# Patient Record
Sex: Female | Born: 1935 | Race: Black or African American | Hispanic: No | Smoking: Never smoker
Health system: Southern US, Community
[De-identification: ages and names within clinical notes are randomized; demographics above are authoritative.]

## PROBLEM LIST (undated history)

## (undated) DIAGNOSIS — R011 Cardiac murmur, unspecified: Secondary | ICD-10-CM

## (undated) DIAGNOSIS — I10 Essential (primary) hypertension: Secondary | ICD-10-CM

## (undated) HISTORY — PX: TONSILLECTOMY: SUR1361

## (undated) HISTORY — DX: Cardiac murmur, unspecified: R01.1

## (undated) HISTORY — PX: WISDOM TOOTH EXTRACTION: SHX21

## (undated) HISTORY — DX: Essential (primary) hypertension: I10

---

## 1971-06-17 HISTORY — PX: ABDOMINAL HYSTERECTOMY: SHX81

## 1971-06-17 HISTORY — PX: OTHER SURGICAL HISTORY: SHX169

## 2011-05-13 ENCOUNTER — Encounter (INDEPENDENT_AMBULATORY_CARE_PROVIDER_SITE_OTHER): Payer: Self-pay | Admitting: Surgery

## 2011-05-13 ENCOUNTER — Other Ambulatory Visit (INDEPENDENT_AMBULATORY_CARE_PROVIDER_SITE_OTHER): Payer: Self-pay | Admitting: General Surgery

## 2011-05-13 ENCOUNTER — Ambulatory Visit (INDEPENDENT_AMBULATORY_CARE_PROVIDER_SITE_OTHER): Payer: Medicare Other | Admitting: Surgery

## 2011-05-13 ENCOUNTER — Other Ambulatory Visit (INDEPENDENT_AMBULATORY_CARE_PROVIDER_SITE_OTHER): Payer: Self-pay | Admitting: Surgery

## 2011-05-13 VITALS — BP 136/86 | HR 72 | Temp 96.7°F | Resp 16 | Ht 66.0 in | Wt 205.1 lb

## 2011-05-13 DIAGNOSIS — L723 Sebaceous cyst: Secondary | ICD-10-CM

## 2011-05-13 DIAGNOSIS — L02212 Cutaneous abscess of back [any part, except buttock]: Secondary | ICD-10-CM

## 2011-05-13 DIAGNOSIS — L089 Local infection of the skin and subcutaneous tissue, unspecified: Secondary | ICD-10-CM

## 2011-05-13 DIAGNOSIS — L02219 Cutaneous abscess of trunk, unspecified: Secondary | ICD-10-CM

## 2011-05-13 DIAGNOSIS — L03319 Cellulitis of trunk, unspecified: Secondary | ICD-10-CM

## 2011-05-13 MED ORDER — HYDROCODONE-ACETAMINOPHEN 5-500 MG PO TABS: 1.0000 | ORAL_TABLET | Freq: Every day | ORAL | Status: AC

## 2011-05-13 MED ORDER — DOXYCYCLINE HYCLATE 100 MG PO TABS: 100.0000 mg | ORAL_TABLET | Freq: Two times a day (BID) | ORAL | Status: AC

## 2011-05-13 NOTE — Progress Notes (Signed)
Patient ID: Kristina Randall, female   DOB: 1933-08-20, 77 y.o.   MRN: [id number]  Chief Complaint  Patient presents with  . Other    recurrent upper left back abscess     HPI Kristina Randall is a 77 y.o. female.HPI The patient presents at the request of Dr. Parke Simmers due to an abscess over her left shoulder. It has been present for one week. It is red. It is painful. It is not draining. She denies any fever or chills. She has had this problem in the past.  Past Medical History  Diagnosis Date  . Hypertension   . Heart murmur     Past Surgical History  Procedure Date  . Abdominal hysterectomy 1973  . Breast surgery 1974    right breast lumpectomy   . Tonsillectomy   . Wisdom tooth extraction     History reviewed. No pertinent family history.  Social History History  Substance Use Topics  . Smoking status: Never Smoker   . Smokeless tobacco: Never Used  . Alcohol Use: No    No Known Allergies  Current Outpatient Prescriptions  Medication Sig Dispense Refill  . Cholecalciferol (VITAMIN D PO) Take by mouth daily.        . Cyanocobalamin (VITAMIN B-12 PO) Take by mouth daily.        . fish oil-omega-3 fatty acids 1000 MG capsule Take 2 g by mouth daily.        Marland Kitchen GARLIC PO Take by mouth daily.        . Multiple Vitamin (MULTIVITAMIN PO) Take by mouth daily.        . valsartan-hydrochlorothiazide (DIOVAN-HCT) 320-25 MG per tablet Take 1 tablet by mouth daily.        Marland Kitchen VITAMIN A PO Take by mouth daily.        Marland Kitchen doxycycline (VIBRA-TABS) 100 MG tablet Take 1 tablet (100 mg total) by mouth 2 (two) times daily. For infection  28 tablet  0  . HYDROcodone-acetaminophen (VICODIN) 5-500 MG per tablet Take 1 tablet by mouth at bedtime.  20 tablet  0    Review of Systems Review of Systems  Constitutional: Negative for fever, chills and unexpected weight change.  HENT: Negative for hearing loss, congestion, sore throat, trouble swallowing and voice change.   Eyes: Negative for visual disturbance.    Respiratory: Negative for cough and wheezing.   Cardiovascular: Negative for chest pain, palpitations and leg swelling.  Gastrointestinal: Negative for nausea, vomiting, abdominal pain, diarrhea, constipation, blood in stool, abdominal distention and anal bleeding.  Genitourinary: Negative for hematuria, vaginal bleeding and difficulty urinating.  Musculoskeletal: Negative for arthralgias.  Skin: Negative for rash and wound.  Neurological: Negative for seizures, syncope and headaches.  Hematological: Negative for adenopathy. Does not bruise/bleed easily.  Psychiatric/Behavioral: Negative for confusion.    Blood pressure 136/86, pulse 72, temperature 96.7 F (35.9 C), temperature source Temporal, resp. rate 16, height 5\' 6"  (1.676 m), weight 205 lb 2 oz (93.044 kg).  Physical Exam Physical Exam  Constitutional: She appears well-developed and well-nourished.  HENT:  Head: Normocephalic and atraumatic.  Eyes: EOM are normal. Pupils are equal, round, and reactive to light.  Skin:       4 cm abscess over left shoulder red and fluctuant not draining    Data Reviewed   Assessment    Abscess left shoulder measuring 4 cm    Plan    I recommend incision and drainage under local anesthesia today in the office. Risk  of bleeding, infection and recurrence or further surgery discussed. She agrees proceed. Under sterile conditions the skin over her left shoulder was prepped. Using Betadine. 1% lidocaine was used and injected the left shoulder abscess. This was excised using a cruciate incision. Pus drained. It was cultured. This was packed with quarter-inch packing. A dry dressing applied. She tolerated the procedure well.       Jaely Silman A. 05/13/2011, 3:54 PM

## 2011-05-13 NOTE — Patient Instructions (Signed)
Abscess An abscess (boil or furuncle) is an infected area that contains a collection of pus.  SYMPTOMS Signs and symptoms of an abscess include pain, tenderness, redness, or hardness. You may feel a moveable soft area under your skin. An abscess can occur anywhere in the body.  TREATMENT  A surgical cut (incision) may be made over your abscess to drain the pus. Gauze may be packed into the space or a drain may be looped through the abscess cavity (pocket). This provides a drain that will allow the cavity to heal from the inside outwards. The abscess may be painful for a few days, but should feel much better if it was drained.  Your abscess, if seen early, may not have localized and may not have been drained. If not, another appointment may be required if it does not get better on its own or with medications. HOME CARE INSTRUCTIONS   Only take over-the-counter or prescription medicines for pain, discomfort, or fever as directed by your caregiver.   Take your antibiotics as directed if they were prescribed. Finish them even if you start to feel better.   Keep the skin and clothes clean around your abscess.   If the abscess was drained, you will need to use gauze dressing to collect any draining pus. Dressings will typically need to be changed 3 or more times a day.   The infection may spread by skin contact with others. Avoid skin contact as much as possible.   Practice good hygiene. This includes regular hand washing, cover any draining skin lesions, and do not share personal care items.   If you participate in sports, do not share athletic equipment, towels, whirlpools, or personal care items. Shower after every practice or tournament.   If a draining area cannot be adequately covered:   Do not participate in sports.   Children should not participate in day care until the wound has healed or drainage stops.   If your caregiver has given you a follow-up appointment, it is very important  to keep that appointment. Not keeping the appointment could result in a much worse infection, chronic or permanent injury, pain, and disability. If there is any problem keeping the appointment, you must call back to this facility for assistance.  SEEK MEDICAL CARE IF:   You develop increased pain, swelling, redness, drainage, or bleeding in the wound site.   You develop signs of generalized infection including muscle aches, chills, fever, or a general ill feeling.   You have an oral temperature above 102 F (38.9 C).  MAKE SURE YOU:   Understand these instructions.   Will watch your condition.   Will get help right away if you are not doing well or get worse.  Document Released: 03/12/2005 Document Revised: 02/12/2011 Document Reviewed: 01/04/2008 [facility] Patient Information 2012 [place], [place].Incision and Drainage of Abscess An abscess (boil or furuncle) is an area infected by germs that contains a collection of pus. Signs and problems (symptoms) of an abscess include pain, tenderness, redness, or hardness. You may feel a moveable, soft area under your skin. An abscess can occur anywhere in the body. Occasionally, this may spread to surrounding tissues causing cellulitis. Sometimes, a surgeon may make a cut (incision) over your abscess. The pus is drained. Gauze may be packed into the space to provide a drain. Keeping a drain or piece of gauze in the incision keeps the skin from healing first. This helps stop the abscess from forming again. The area may be  painful for 5 to 7 days. Most people with an abscess do not have high fevers. If seen early, your abscess may not have localized and may not be cut. If it does not get better on its own or with medicines, you may require another appointment. HOME CARE INSTRUCTIONS   Only take over-the-counter or prescription medicines for pain, discomfort, or fever as directed by your caregiver. Use these only if your caregiver has not given medicines that  would interfere.   When you bathe, remove the gauze drain after soaking. You may then wash the wound gently with mild, soapy water. Repack with gauze as your caregiver directs.   See your caregiver as directed for a recheck.   If antibiotics were prescribed, take them as directed.  SEEK MEDICAL CARE IF:   You develop increased pain, swelling, redness, drainage, or bleeding in the wound site.   You develop signs of generalized infection, including muscle aches, chills, or a general ill feeling.   You or your child has an oral temperature above 102 F (38.9 C).  MAKE SURE YOU:   Understand these instructions.   Will watch your condition.   Will get help right away if you are not doing well or get worse.  Document Released: 11/26/2000 Document Revised: 02/12/2011 Document Reviewed: 01/21/2008 [facility] Patient Information 2012 [place], [place].

## 2011-05-16 LAB — WOUND CULTURE: Organism ID, Bacteria: NO GROWTH

## 2011-05-28 ENCOUNTER — Encounter (INDEPENDENT_AMBULATORY_CARE_PROVIDER_SITE_OTHER): Payer: Medicare Other | Admitting: Surgery

## 2011-06-05 ENCOUNTER — Encounter (INDEPENDENT_AMBULATORY_CARE_PROVIDER_SITE_OTHER): Payer: Self-pay | Admitting: Surgery

## 2011-07-21 ENCOUNTER — Encounter (INDEPENDENT_AMBULATORY_CARE_PROVIDER_SITE_OTHER): Payer: Medicare Other | Admitting: Surgery

## 2011-08-07 ENCOUNTER — Encounter (INDEPENDENT_AMBULATORY_CARE_PROVIDER_SITE_OTHER): Payer: Medicare Other | Admitting: Surgery

## 2014-04-18 LAB — HM MAMMOGRAPHY

## 2015-10-27 ENCOUNTER — Emergency Department (HOSPITAL_COMMUNITY)
Admission: EM | Admit: 2015-10-27 | Discharge: 2015-10-27 | Disposition: A | Discharge status: Home / Self Care | Payer: Medicare Other | Attending: Emergency Medicine | Admitting: Emergency Medicine

## 2015-10-27 ENCOUNTER — Encounter (HOSPITAL_COMMUNITY): Payer: Self-pay | Admitting: Emergency Medicine

## 2015-10-27 DIAGNOSIS — I10 Essential (primary) hypertension: Secondary | ICD-10-CM | POA: Diagnosis not present

## 2015-10-27 DIAGNOSIS — Z79899 Other long term (current) drug therapy: Secondary | ICD-10-CM | POA: Insufficient documentation

## 2015-10-27 DIAGNOSIS — Z760 Encounter for issue of repeat prescription: Secondary | ICD-10-CM | POA: Insufficient documentation

## 2015-10-27 DIAGNOSIS — R011 Cardiac murmur, unspecified: Secondary | ICD-10-CM | POA: Insufficient documentation

## 2015-10-27 MED ORDER — VALSARTAN-HYDROCHLOROTHIAZIDE 320-25 MG PO TABS: 1.0000 | ORAL_TABLET | Freq: Every day | ORAL | Status: DC

## 2015-10-27 NOTE — ED Provider Notes (Signed)
CSN: [id number]     Arrival date & time 10/27/15  0919 History   First MD Initiated Contact with Patient 10/27/15 [phone]     Chief Complaint  Patient presents with  . rx refill    (Consider location/radiation/quality/duration/timing/severity/associated sxs/prior Treatment) HPI 82 y.o. female, presents to the Emergency Department today for medication refill. Almost out of BP meds. States that she just moved from [place] and has yet to establish PCP. No acute conditions. No pain. No CP/SOB/ABD pain. No N/V/D. No headaches. No changes in vision. No numbness/tingling. No other symptoms noted.    Past Medical History  Diagnosis Date  . Hypertension   . Heart murmur    Past Surgical History  Procedure Laterality Date  . Abdominal hysterectomy  1973  . Breast surgery  1974    right breast lumpectomy   . Tonsillectomy    . Wisdom tooth extraction     History reviewed. No pertinent family history. Social History  Substance Use Topics  . Smoking status: Never Smoker   . Smokeless tobacco: Never Used  . Alcohol Use: No   OB History    No data available     Review of Systems ROS reviewed and all are negative for acute change except as noted in the HPI.  Allergies  Review of patient's allergies indicates no known allergies.  Home Medications   Prior to Admission medications   Medication Sig Start Date End Date Taking? Authorizing Provider  Cholecalciferol (VITAMIN D PO) Take by mouth daily.      Historical Provider, MD  Cyanocobalamin (VITAMIN B-12 PO) Take by mouth daily.      Historical Provider, MD  fish oil-omega-3 fatty acids 1000 MG capsule Take 2 g by mouth daily.      Historical Provider, MD  GARLIC PO Take by mouth daily.      Historical Provider, MD  Multiple Vitamin (MULTIVITAMIN PO) Take by mouth daily.      Historical Provider, MD  valsartan-hydrochlorothiazide (DIOVAN-HCT) 320-25 MG per tablet Take 1 tablet by mouth daily.      Historical Provider, MD  VITAMIN A  PO Take by mouth daily.      Historical Provider, MD   BP 151/80 mmHg  Pulse 64  Temp(Src) 97.2 F (36.2 C) (Oral)  Resp 18  Wt 81.647 kg  SpO2 98%   Physical Exam  Constitutional: She is oriented to person, place, and time. She appears well-developed and well-nourished.  HENT:  Head: Normocephalic and atraumatic.  Eyes: EOM are normal.  Neck: Normal range of motion.  Cardiovascular: Normal rate, regular rhythm, normal heart sounds and intact distal pulses.   No murmur heard. Pulmonary/Chest: Effort normal and breath sounds normal. No respiratory distress. She has no wheezes. She has no rales. She exhibits no tenderness.  Abdominal: Soft. Bowel sounds are normal.  Musculoskeletal: Normal range of motion.  Neurological: She is alert and oriented to person, place, and time.  Skin: Skin is warm and dry.  Psychiatric: She has a normal mood and affect. Her behavior is normal. Thought content normal.  Nursing note and vitals reviewed.  ED Course  Procedures (including critical care time) Labs Review Labs Reviewed - No data to display  Imaging Review No results found. I have personally reviewed and evaluated these images and lab results as part of my medical decision-making.   EKG Interpretation None      MDM  I have reviewed the relevant previous healthcare records. I obtained HPI from historian.  ED Course:  Assessment: Pt is a 82yF with hx HTN who presents for medication refill. On exam, pt in NAD. Nontoxic/nonseptic appearing. VSS. Afebrile. Lungs CTA. Heart RRR. Abdomen nontender soft. No acute conditions identified. Refilled Valstartan-HCTZ. Plan is to DC Home with follow up to PCp. At time of discharge, Patient is in no acute distress. Vital Signs are stable. Patient is able to ambulate. Patient able to tolerate PO.    Disposition/Plan:  DC Home Additional Verbal discharge instructions given and discussed with patient.  Pt Instructed to f/u with PCP in the next week  for evaluation and treatment of symptoms. Return precautions given Pt acknowledges and agrees with plan  Supervising Physician Raeford Razor, MD   Final diagnoses:  Medication refill      Audry Pili, PA-C 10/27/15 1610  Raeford Razor, MD 10/28/15 [phone]

## 2015-10-27 NOTE — ED Notes (Signed)
Pt recently moved back to [place] and doesn't have a regular doctor, needs BP meds refilled-- in process of trying to find a regular MD

## 2015-10-27 NOTE — Discharge Instructions (Signed)
Please read and follow all provided instructions.  Your diagnoses today include:  1. Medication refill    Tests performed today include:  Vital signs. See below for your results today.   Medications prescribed:   Take as prescribed   Home care instructions:  Follow any educational materials contained in this packet.  Follow-up instructions: Please follow-up with your primary care provider in the next 48 hours for further evaluation of symptoms and treatment   Return instructions:   Please return to the Emergency Department if you do not get better, if you get worse, or new symptoms OR  - Fever (temperature greater than 101.70F)  - Bleeding that does not stop with holding pressure to the area    -Severe pain (please note that you may be more sore the day after your accident)  - Chest Pain  - Difficulty breathing  - Severe nausea or vomiting  - Inability to tolerate food and liquids  - Passing out  - Skin becoming red around your wounds  - Change in mental status (confusion or lethargy)  - New numbness or weakness     Please return if you have any other emergent concerns.  Additional Information:  Your vital signs today were: BP 151/80 mmHg   Pulse 64   Temp(Src) 97.2 F (36.2 C) (Oral)   Resp 18   Wt 81.647 kg   SpO2 98% If your blood pressure (BP) was elevated above 135/85 this visit, please have this repeated by your doctor within one month. ---------------

## 2015-12-10 ENCOUNTER — Encounter (HOSPITAL_COMMUNITY): Payer: Self-pay | Admitting: Emergency Medicine

## 2015-12-10 ENCOUNTER — Ambulatory Visit (HOSPITAL_COMMUNITY)
Admission: EM | Admit: 2015-12-10 | Discharge: 2015-12-10 | Disposition: A | Discharge status: Home / Self Care | Payer: Medicare HMO | Attending: Family Medicine | Admitting: Family Medicine

## 2015-12-10 DIAGNOSIS — I10 Essential (primary) hypertension: Secondary | ICD-10-CM

## 2015-12-10 LAB — POCT I-STAT, CHEM 8
BUN: 15 mg/dL (ref 6–20)
CREATININE: 1 mg/dL (ref 0.44–1.00)
Calcium, Ion: 1.24 mmol/L (ref 1.13–1.30)
Chloride: 106 mmol/L (ref 101–111)
Glucose, Bld: 88 mg/dL (ref 65–99)
HEMATOCRIT: 38 % (ref 36.0–46.0)
Hemoglobin: 12.9 g/dL (ref 12.0–15.0)
POTASSIUM: 4.5 mmol/L (ref 3.5–5.1)
Sodium: 146 mmol/L — ABNORMAL HIGH (ref 135–145)
TCO2: 28 mmol/L (ref 0–100)

## 2015-12-10 MED ORDER — VALSARTAN-HYDROCHLOROTHIAZIDE 320-25 MG PO TABS: 1.0000 | ORAL_TABLET | Freq: Every day | ORAL | Status: DC

## 2015-12-10 NOTE — ED Notes (Signed)
The patient presented to the [facility] with a complaint of need ing her HTN medicine, Diovan, refilled as well as a referral to a PCP.

## 2015-12-10 NOTE — Discharge Instructions (Signed)
Take your medicine and see your doctor for further refills.

## 2015-12-10 NOTE — ED Provider Notes (Signed)
CSN: [id number]     Arrival date & time 12/10/15  1259 History   First MD Initiated Contact with Patient 12/10/15 1317     Chief Complaint  Patient presents with  . Medication Refill   (Consider location/radiation/quality/duration/timing/severity/associated sxs/prior Treatment) Patient is a 82 y.o. female presenting with hypertension. The history is provided by the patient.  Hypertension This is a chronic problem. The current episode started 2 days ago (ran out of med 2d ago.). The problem has not changed since onset.Pertinent negatives include no chest pain and no headaches.    Past Medical History  Diagnosis Date  . Hypertension   . Heart murmur    Past Surgical History  Procedure Laterality Date  . Abdominal hysterectomy  1973  . Breast surgery  1974    right breast lumpectomy   . Tonsillectomy    . Wisdom tooth extraction     History reviewed. No pertinent family history. Social History  Substance Use Topics  . Smoking status: Never Smoker   . Smokeless tobacco: Never Used  . Alcohol Use: No   OB History    No data available     Review of Systems  Constitutional: Negative.   Respiratory: Negative.   Cardiovascular: Negative.  Negative for chest pain and leg swelling.  Neurological: Negative.  Negative for dizziness and headaches.  All other systems reviewed and are negative.   Allergies  Review of patient's allergies indicates no known allergies.  Home Medications   Prior to Admission medications   Medication Sig Start Date End Date Taking? Authorizing Provider  Cholecalciferol (VITAMIN D PO) Take by mouth daily.      Historical Provider, MD  Cyanocobalamin (VITAMIN B-12 PO) Take by mouth daily.      Historical Provider, MD  fish oil-omega-3 fatty acids 1000 MG capsule Take 2 g by mouth daily.      Historical Provider, MD  GARLIC PO Take by mouth daily.      Historical Provider, MD  Multiple Vitamin (MULTIVITAMIN PO) Take by mouth daily.      Historical  Provider, MD  valsartan-hydrochlorothiazide (DIOVAN-HCT) 320-25 MG tablet Take 1 tablet by mouth daily. 12/10/15   Linna HoffJames D Cristina Mattern, MD  VITAMIN A PO Take by mouth daily.      Historical Provider, MD   Meds Ordered and Administered this Visit  Medications - No data to display  BP 149/75 mmHg  Pulse 59  Temp(Src) 98 F (36.7 C) (Oral)  Resp 18  SpO2 98%  LMP  (Approximate) No data found.   Physical Exam  Constitutional: She is oriented to person, place, and time. She appears well-developed and well-nourished. No distress.  Neck: Normal range of motion. Neck supple.  Cardiovascular: Normal rate, regular rhythm, normal heart sounds and intact distal pulses.   Pulmonary/Chest: Effort normal and breath sounds normal.  Lymphadenopathy:    She has no cervical adenopathy.  Neurological: She is alert and oriented to person, place, and time.  Skin: Skin is warm and dry.  Nursing note and vitals reviewed.   ED Course  Procedures (including critical care time)  Labs Review Labs Reviewed  POCT I-STAT, CHEM 8 - Abnormal; Notable for the following:    Sodium 146 (*)    All other components within normal limits   i-stat wnl.  Imaging Review No results found.   Visual Acuity Review  Right Eye Distance:   Left Eye Distance:   Bilateral Distance:    Right Eye Near:   Left  Eye Near:    Bilateral Near:         MDM   1. Essential hypertension       Linna HoffJames D Gerre Ranum, MD 12/18/15 1659

## 2015-12-11 ENCOUNTER — Telehealth (HOSPITAL_COMMUNITY): Payer: Self-pay | Admitting: Emergency Medicine

## 2015-12-11 NOTE — ED Notes (Signed)
[facility] calling to change her medication Diovan 320-25 to a 320-12.5.  She states she was taking this previously from [facility].  Dr. Artis FlockKindl approved the change to the medication and the pharmacist was notified.

## 2016-01-01 ENCOUNTER — Ambulatory Visit (INDEPENDENT_AMBULATORY_CARE_PROVIDER_SITE_OTHER): Payer: Medicare HMO | Admitting: Nurse Practitioner

## 2016-01-01 ENCOUNTER — Encounter: Payer: Self-pay | Admitting: Nurse Practitioner

## 2016-01-01 VITALS — BP 138/86 | HR 64 | Temp 97.9°F | Resp 17 | Ht 66.5 in | Wt 192.0 lb

## 2016-01-01 DIAGNOSIS — I10 Essential (primary) hypertension: Secondary | ICD-10-CM

## 2016-01-01 DIAGNOSIS — R413 Other amnesia: Secondary | ICD-10-CM | POA: Diagnosis not present

## 2016-01-01 DIAGNOSIS — F411 Generalized anxiety disorder: Secondary | ICD-10-CM

## 2016-01-01 NOTE — Patient Instructions (Signed)
To bring medication and supplements to next visit   Follow up in 1 month for physical with fasting lab work prior to appt
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
## 2016-01-01 NOTE — Addendum Note (Signed)
Addended by: Chriss DriverLANE, Alesa Echevarria L on: 01/01/2016 11:12 AM   Modules accepted: Orders, Medications

## 2016-01-24 ENCOUNTER — Other Ambulatory Visit: Payer: Medicare HMO

## 2016-01-24 DIAGNOSIS — R413 Other amnesia: Secondary | ICD-10-CM

## 2016-01-24 DIAGNOSIS — I10 Essential (primary) hypertension: Secondary | ICD-10-CM

## 2016-01-24 LAB — COMPLETE METABOLIC PANEL WITH GFR
ALBUMIN: 4.3 g/dL (ref 3.6–5.1)
ALK PHOS: 68 U/L (ref 33–130)
ALT: 10 U/L (ref 6–29)
AST: 16 U/L (ref 10–35)
BUN: 14 mg/dL (ref 7–25)
CO2: 30 mmol/L (ref 20–31)
CREATININE: 0.87 mg/dL (ref 0.60–0.88)
Calcium: 9.9 mg/dL (ref 8.6–10.4)
Chloride: 103 mmol/L (ref 98–110)
GFR, Est African American: 73 mL/min (ref >=60)
GFR, Est Non African American: 63 mL/min (ref >=60)
GLUCOSE: 81 mg/dL (ref 65–99)
POTASSIUM: 4.3 mmol/L (ref 3.5–5.3)
SODIUM: 141 mmol/L (ref 135–146)
Total Bilirubin: 0.5 mg/dL (ref 0.2–1.2)
Total Protein: 7.5 g/dL (ref 6.1–8.1)

## 2016-01-24 LAB — CBC WITH DIFFERENTIAL/PLATELET
Basophils Absolute: 0 cells/uL (ref 0–200)
Basophils Relative: 0 %
Eosinophils Absolute: 410 cells/uL (ref 15–500)
Eosinophils Relative: 5 %
HCT: 36 % (ref 35.0–45.0)
Hemoglobin: 11.6 g/dL — ABNORMAL LOW (ref 11.7–15.5)
Lymphocytes Relative: 35 %
Lymphs Abs: 2870 cells/uL (ref 850–3900)
MCH: 26.6 pg — ABNORMAL LOW (ref 27.0–33.0)
MCHC: 32.2 g/dL (ref 32.0–36.0)
MCV: 82.6 fL (ref 80.0–100.0)
MPV: 10.2 fL (ref 7.5–12.5)
Monocytes Absolute: 492 cells/uL (ref 200–950)
Monocytes Relative: 6 %
Neutro Abs: 4428 cells/uL (ref 1500–7800)
Neutrophils Relative %: 54 %
Platelets: 245 10*3/uL (ref 140–400)
RBC: 4.36 MIL/uL (ref 3.80–5.10)
RDW: 16.5 % — ABNORMAL HIGH (ref 11.0–15.0)
WBC: 8.2 10*3/uL (ref 3.8–10.8)

## 2016-01-24 LAB — LIPID PANEL
CHOL/HDL RATIO: 3 ratio (ref <=5.0)
CHOLESTEROL: 154 mg/dL (ref 125–200)
HDL: 51 mg/dL (ref >=46)
LDL Cholesterol: 85 mg/dL (ref <130)
Triglycerides: 92 mg/dL (ref <150)
VLDL: 18 mg/dL (ref <30)

## 2016-01-24 LAB — TSH: TSH: 3.74 mIU/L

## 2016-01-29 ENCOUNTER — Ambulatory Visit (INDEPENDENT_AMBULATORY_CARE_PROVIDER_SITE_OTHER): Payer: Medicare HMO | Admitting: Nurse Practitioner

## 2016-01-29 ENCOUNTER — Encounter: Payer: Self-pay | Admitting: Nurse Practitioner

## 2016-01-29 ENCOUNTER — Telehealth: Payer: Self-pay

## 2016-01-29 VITALS — BP 124/82 | HR 78 | Temp 98.0°F | Resp 18 | Ht 67.0 in | Wt 191.6 lb

## 2016-01-29 DIAGNOSIS — I10 Essential (primary) hypertension: Secondary | ICD-10-CM | POA: Diagnosis not present

## 2016-01-29 DIAGNOSIS — Z1231 Encounter for screening mammogram for malignant neoplasm of breast: Secondary | ICD-10-CM

## 2016-01-29 DIAGNOSIS — I00 Rheumatic fever without heart involvement: Secondary | ICD-10-CM | POA: Insufficient documentation

## 2016-01-29 DIAGNOSIS — Z0000 Encounter for general adult medical examination without abnormal findings: Secondary | ICD-10-CM

## 2016-01-29 DIAGNOSIS — E2839 Other primary ovarian failure: Secondary | ICD-10-CM

## 2016-01-29 DIAGNOSIS — R413 Other amnesia: Secondary | ICD-10-CM | POA: Diagnosis not present

## 2016-01-29 NOTE — Telephone Encounter (Signed)
I called patient to see who [place] provider [place]. Patient needs to complete a medical release form so that her records can be requested from previous provider. While she was in office today for her appointment, she filled out most of the form but she could not remember who her last doctor was. She was going to call the office once she got home with that information.   I could not leave a message due to voicemail not being set up.

## 2016-01-29 NOTE — Patient Instructions (Addendum)
Recommend 30 mins activity 5 days a week Recommend dental care every 6 months  To CALL with previous doctors office so we  can obtain last medical records
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
## 2016-01-30 ENCOUNTER — Telehealth: Payer: Self-pay

## 2016-01-30 NOTE — Telephone Encounter (Signed)
Patient called to give Kristina MageJessica Eubanks,NP and her CMA Kristina Randall(Terry) information requested at her visit yesterday.  Previous PCP was:  Kristina NeedleMichael A. Cevasco, PA    Address: [address] [place] [address], [place], [place] [address]  Phone: [phone]  Patient is still trying to locate her las mammogram information.

## 2016-01-30 NOTE — Telephone Encounter (Signed)
Molli KnockOkay we will need to request records

## 2016-01-30 NOTE — Telephone Encounter (Signed)
Did you or Aurther Lofterry have the patient sign a release of records yesterday?

## 2016-01-31 NOTE — Telephone Encounter (Signed)
Patient did sign a release of records form while she was in the office on 01/29/16. Release form was given to Nicole CellaDorothy to fax to patient's previous provider yesterday.

## 2016-02-12 ENCOUNTER — Telehealth: Payer: Self-pay

## 2016-02-12 NOTE — Telephone Encounter (Signed)
I called patient's pervious doctor to find out about patient's vaccine history. I spoke to Nellis AFBMichelle and she told me that the patient did not have any vaccine records on file with them. She stated that her office only give the flu vaccine and patient declined to get that vaccine in 2015. This is the only record that they have as far as vaccine history goes for this patient.

## 2016-02-12 NOTE — Telephone Encounter (Signed)
I called patient to ask if what provider she had been seeing since 2015 because the records we received from her previous provider([facility])  were only for 2015.  Patient stated that she has not seen a regular doctor since 2015. She [facility] to see about getting a refill on her blood pressure medications.   I also asked patient for the name of the provider that she was seen by before she became a patient of [facility]. Patient stated that she could not remember the name of who she saw but she would call back if she remembered it.

## 2016-02-13 NOTE — Telephone Encounter (Signed)
Patient did not return my call about which provider she was seen by before she went to [facility].

## 2016-02-14 ENCOUNTER — Telehealth: Payer: Self-pay

## 2016-02-14 NOTE — Telephone Encounter (Signed)
Patient brought in the name and address of the provider that she saw prior to moving to [place], where [facility] was [facility].   The requested information is as follows:  [facility] [address], [address] [place] [address]  Dr. Geraldo PitterVeita J. Bland  Phone: [phone] Fax; [phone]

## 2016-02-15 ENCOUNTER — Other Ambulatory Visit: Payer: Self-pay | Admitting: *Deleted

## 2016-02-15 MED ORDER — VALSARTAN-HYDROCHLOROTHIAZIDE 320-12.5 MG PO TABS: 1.0000 | ORAL_TABLET | Freq: Every day | ORAL | 1 refills | Status: DC

## 2016-02-15 NOTE — Telephone Encounter (Signed)
[facility] 

## 2016-02-19 ENCOUNTER — Other Ambulatory Visit: Payer: Self-pay | Admitting: *Deleted

## 2016-02-19 MED ORDER — VALSARTAN-HYDROCHLOROTHIAZIDE 320-12.5 MG PO TABS: 1.0000 | ORAL_TABLET | Freq: Every day | ORAL | 3 refills | Status: DC

## 2016-02-19 NOTE — Telephone Encounter (Signed)
[facility]

## 2016-03-26 ENCOUNTER — Encounter: Payer: Self-pay | Admitting: Nurse Practitioner

## 2016-03-31 ENCOUNTER — Other Ambulatory Visit: Payer: Self-pay | Admitting: Nurse Practitioner

## 2016-03-31 DIAGNOSIS — Z1239 Encounter for other screening for malignant neoplasm of breast: Secondary | ICD-10-CM

## 2016-04-14 ENCOUNTER — Ambulatory Visit
Admission: RE | Admit: 2016-04-14 | Discharge: 2016-04-14 | Disposition: A | Discharge status: Home / Self Care | Payer: Medicare HMO | Source: Ambulatory Visit | Attending: Nurse Practitioner | Admitting: Nurse Practitioner

## 2016-04-14 ENCOUNTER — Other Ambulatory Visit: Payer: Medicare HMO

## 2016-04-14 DIAGNOSIS — Z1239 Encounter for other screening for malignant neoplasm of breast: Secondary | ICD-10-CM

## 2016-04-15 ENCOUNTER — Ambulatory Visit
Admission: RE | Admit: 2016-04-15 | Discharge: 2016-04-15 | Disposition: A | Discharge status: Home / Self Care | Payer: Medicare HMO | Source: Ambulatory Visit | Attending: Nurse Practitioner | Admitting: Nurse Practitioner

## 2016-04-15 DIAGNOSIS — E2839 Other primary ovarian failure: Secondary | ICD-10-CM

## 2016-04-28 ENCOUNTER — Encounter: Payer: Self-pay | Admitting: Internal Medicine

## 2016-04-28 ENCOUNTER — Ambulatory Visit (INDEPENDENT_AMBULATORY_CARE_PROVIDER_SITE_OTHER): Payer: Medicare HMO | Admitting: Internal Medicine

## 2016-04-28 VITALS — BP 130/80 | HR 80 | Temp 97.8°F | Wt 192.0 lb

## 2016-04-28 DIAGNOSIS — I10 Essential (primary) hypertension: Secondary | ICD-10-CM | POA: Diagnosis not present

## 2016-04-28 DIAGNOSIS — Z23 Encounter for immunization: Secondary | ICD-10-CM

## 2016-04-28 DIAGNOSIS — R011 Cardiac murmur, unspecified: Secondary | ICD-10-CM | POA: Diagnosis not present

## 2016-04-28 DIAGNOSIS — Z8679 Personal history of other diseases of the circulatory system: Secondary | ICD-10-CM | POA: Diagnosis not present

## 2016-04-28 DIAGNOSIS — G3184 Mild cognitive impairment, so stated: Secondary | ICD-10-CM | POA: Insufficient documentation

## 2016-04-28 NOTE — Patient Instructions (Signed)
Please be sure to complete your advance directives:  Living will and health care power of attorney and bring us a copy for your file.  I'd like it if your daughter could join you for your next appointment so we can get her input about your memory.
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
## 2016-07-15 ENCOUNTER — Telehealth: Payer: Self-pay | Admitting: *Deleted

## 2016-07-15 NOTE — Telephone Encounter (Signed)
Patient called upset and stated that she has an appointment on 07/29/16 and wants to know why she was told to bring her daughter with her. Stated that her daughter would have to take off of work to come. Patient also stated that she usually only comes back once a year and doesn't understand why she has to come back so soon. Tried to explain to patient that it was to discuss memory. Patient stated that she does not think that visit is NOT necessary. She stated that [facility] and as far as her memory goes, sure she is forgetful, but she is not getting loss and not roaming away from home. She stated that she is not that bad that her daughter has to take off of work to come out here. Patient thinks you are pushing it. Patient wants to cancel appointment.

## 2016-07-16 NOTE — Telephone Encounter (Signed)
I'm sorry she feels that way. Shanda BumpsJessica and I have both been quite concerned about her progressing memory loss and wanted her daughter to be more involved in her care.  We discussed that at the last appt and she was agreeable and we had said she would need to have the appt later in the day so her daughter would be able to attend (due to her work schedule).  Evidently, she does not recall that conversation.  She always has the right to cancel her appt.  Also, she is Jessica's primary patient.

## 2016-07-29 ENCOUNTER — Ambulatory Visit: Payer: Medicare HMO | Admitting: Nurse Practitioner

## 2017-05-08 ENCOUNTER — Other Ambulatory Visit: Payer: Self-pay | Admitting: Internal Medicine

## 2017-05-12 ENCOUNTER — Ambulatory Visit (INDEPENDENT_AMBULATORY_CARE_PROVIDER_SITE_OTHER): Payer: Medicare HMO | Admitting: Nurse Practitioner

## 2017-05-12 ENCOUNTER — Encounter: Payer: Self-pay | Admitting: Nurse Practitioner

## 2017-05-12 VITALS — BP 144/86 | HR 63 | Temp 98.4°F | Resp 17 | Ht 67.0 in | Wt 183.4 lb

## 2017-05-12 DIAGNOSIS — Z23 Encounter for immunization: Secondary | ICD-10-CM | POA: Diagnosis not present

## 2017-05-12 DIAGNOSIS — I10 Essential (primary) hypertension: Secondary | ICD-10-CM | POA: Diagnosis not present

## 2017-05-12 DIAGNOSIS — R413 Other amnesia: Secondary | ICD-10-CM

## 2017-05-12 MED ORDER — TETANUS-DIPHTH-ACELL PERTUSSIS 5-2.5-18.5 LF-MCG/0.5 IM SUSP: 0.5000 mL | Freq: Once | INTRAMUSCULAR | 0 refills | Status: AC

## 2017-05-12 MED ORDER — VALSARTAN-HYDROCHLOROTHIAZIDE 320-12.5 MG PO TABS: 1.0000 | ORAL_TABLET | Freq: Every day | ORAL | 0 refills | Status: DC

## 2017-05-12 NOTE — Patient Instructions (Signed)
Please scheduled AWV (she is over due)   Follow up in 3 months     DASH Eating Plan DASH stands for "Dietary Approaches to Stop Hypertension." The DASH eating plan is a healthy eating plan that has been shown to reduce high blood pressure (hypertension). It may also reduce your risk for type 2 diabetes, heart disease, and stroke. The DASH eating plan may also help with weight loss. What are tips for following this plan? General guidelines  Avoid eating more than 2,300 mg (milligrams) of salt (sodium) a day. If you have hypertension, you may need to reduce your sodium intake to 1,500 mg a day.  Limit alcohol intake to no more than 1 drink a day for nonpregnant women and 2 drinks a day for men. One drink equals 12 oz of beer, 5 oz of wine, or 1 oz of hard liquor.  Work with your health care provider to maintain a healthy body weight or to lose weight. Ask what an ideal weight is for you.  Get at least 30 minutes of exercise that causes your heart to beat faster (aerobic exercise) most days of the week. Activities may include walking, swimming, or biking.  Work with your health care provider or diet and nutrition specialist (dietitian) to adjust your eating plan to your individual calorie needs. Reading food labels  Check food labels for the amount of sodium per serving. Choose foods with less than 5 percent of the Daily Value of sodium. Generally, foods with less than 300 mg of sodium per serving fit into this eating plan.  To find whole grains, look for the word "whole" as the first word in the ingredient list. Shopping  Buy products labeled as "low-sodium" or "no salt added."  Buy fresh foods. Avoid canned foods and premade or frozen meals. Cooking  Avoid adding salt when cooking. Use salt-free seasonings or herbs instead of table salt or sea salt. Check with your health care provider or pharmacist before using salt substitutes.  Do not fry foods. Cook foods using healthy methods  such as baking, boiling, grilling, and broiling instead.  Cook with heart-healthy oils, such as olive, canola, soybean, or sunflower oil. Meal planning   Eat a balanced diet that includes: ? 5 or more servings of fruits and vegetables each day. At each meal, try to fill half of your plate with fruits and vegetables. ? Up to 6-8 servings of whole grains each day. ? Less than 6 oz of lean meat, poultry, or fish each day. A 3-oz serving of meat is about the same size as a deck of cards. One egg equals 1 oz. ? 2 servings of low-fat dairy each day. ? A serving of nuts, seeds, or beans 5 times each week. ? Heart-healthy fats. Healthy fats called Omega-3 fatty acids are found in foods such as flaxseeds and coldwater fish, like sardines, salmon, and mackerel.  Limit how much you eat of the following: ? Canned or prepackaged foods. ? Food that is high in trans fat, such as fried foods. ? Food that is high in saturated fat, such as fatty meat. ? Sweets, desserts, sugary drinks, and other foods with added sugar. ? Full-fat dairy products.  Do not salt foods before eating.  Try to eat at least 2 vegetarian meals each week.  Eat more home-cooked food and less restaurant, buffet, and fast food.  When eating at a restaurant, ask that your food be prepared with less salt or no salt, if possible. What  foods are recommended? The items listed may not be a complete list. Talk with your dietitian about what dietary choices are best for you. Grains Whole-grain or whole-wheat bread. Whole-grain or whole-wheat pasta. Brown rice. [place]row. Bulgur. Whole-grain and low-sodium cereals. Pita bread. Low-fat, low-sodium crackers. Whole-wheat flour tortillas. Vegetables Fresh or frozen vegetables (raw, steamed, roasted, or grilled). Low-sodium or reduced-sodium tomato and vegetable juice. Low-sodium or reduced-sodium tomato sauce and tomato paste. Low-sodium or reduced-sodium canned vegetables. Fruits All  fresh, dried, or frozen fruit. Canned fruit in natural juice (without added sugar). Meat and other protein foods Skinless chicken or [place]. Ground chicken or [place]. Pork with fat trimmed off. Fish and seafood. Egg whites. Dried beans, peas, or lentils. Unsalted nuts, nut butters, and seeds. Unsalted canned beans. Lean cuts of beef with fat trimmed off. Low-sodium, lean deli meat. Dairy Low-fat (1%) or fat-free (skim) milk. Fat-free, low-fat, or reduced-fat cheeses. Nonfat, low-sodium ricotta or cottage cheese. Low-fat or nonfat yogurt. Low-fat, low-sodium cheese. Fats and oils Soft margarine without trans fats. Vegetable oil. Low-fat, reduced-fat, or light mayonnaise and salad dressings (reduced-sodium). Canola, safflower, olive, soybean, and sunflower oils. Avocado. Seasoning and other foods Herbs. Spices. Seasoning mixes without salt. Unsalted popcorn and pretzels. Fat-free sweets. What foods are not recommended? The items listed may not be a complete list. Talk with your dietitian about what dietary choices are best for you. Grains Baked goods made with fat, such as croissants, muffins, or some breads. Dry pasta or rice meal packs. Vegetables Creamed or fried vegetables. Vegetables in a cheese sauce. Regular canned vegetables (not low-sodium or reduced-sodium). Regular canned tomato sauce and paste (not low-sodium or reduced-sodium). Regular tomato and vegetable juice (not low-sodium or reduced-sodium). Angie Fava. Olives. Fruits Canned fruit in a light or heavy syrup. Fried fruit. Fruit in cream or butter sauce. Meat and other protein foods Fatty cuts of meat. Ribs. Fried meat. Berniece Salines. Sausage. Bologna and other processed lunch meats. Salami. Fatback. Hotdogs. Bratwurst. Salted nuts and seeds. Canned beans with added salt. Canned or smoked fish. Whole eggs or egg yolks. Chicken or [place] with skin. Dairy Whole or 2% milk, cream, and half-and-half. Whole or full-fat cream cheese. Whole-fat or  sweetened yogurt. Full-fat cheese. Nondairy creamers. Whipped toppings. Processed cheese and cheese spreads. Fats and oils Butter. Stick margarine. Lard. Shortening. Ghee. Bacon fat. Tropical oils, such as coconut, palm kernel, or palm oil. Seasoning and other foods Salted popcorn and pretzels. Onion salt, garlic salt, seasoned salt, table salt, and sea salt. Worcestershire sauce. Tartar sauce. Barbecue sauce. Teriyaki sauce. Soy sauce, including reduced-sodium. Steak sauce. Canned and packaged gravies. Fish sauce. Oyster sauce. Cocktail sauce. Horseradish that you find on the shelf. Ketchup. Mustard. Meat flavorings and tenderizers. Bouillon cubes. Hot sauce and Tabasco sauce. Premade or packaged marinades. Premade or packaged taco seasonings. Relishes. Regular salad dressings. Where to find more information:  National Heart, Lung, and [place]: https://wilson-eaton.com/  American Heart Association: www.heart.org Summary  The DASH eating plan is a healthy eating plan that has been shown to reduce high blood pressure (hypertension). It may also reduce your risk for type 2 diabetes, heart disease, and stroke.  With the DASH eating plan, you should limit salt (sodium) intake to 2,300 mg a day. If you have hypertension, you may need to reduce your sodium intake to 1,500 mg a day.  When on the DASH eating plan, aim to eat more fresh fruits and vegetables, whole grains, lean proteins, low-fat dairy, and heart-healthy fats.  Work with  your health care provider or diet and nutrition specialist (dietitian) to adjust your eating plan to your individual calorie needs. This information is not intended to replace advice given to you by your health care provider. Make sure you discuss any questions you have with your health care provider. Document Released: 05/22/2011 Document Revised: 05/26/2016 Document Reviewed: 05/26/2016 Elsevier Interactive Patient Education  2017 [employer]

## 2017-05-12 NOTE — Progress Notes (Signed)
Careteam: Patient Care Team: Kristina Chandler, NP as PCP - General (Geriatric Medicine)  Advanced Directive information Does Patient Have a Medical Advance Directive?: No, Would patient like information on creating a medical advance directive?: No - Patient declined  No Known Allergies  Chief Complaint  Patient presents with  . Medical Management of Chronic Issues    Pt is being seen for a routine visit. Pt reports that she has no concerns today.   . Immunizations    Pt wants to discuss pneumonia vaccine. Rx printed for Tdap. Flu vaccine given by pharmacy 03/30/17     HPI: Patient is a 83 y.o. female seen in the office today for routine follow up. Reports she is doing well.  States she is walking and eating well.  Stays in good shape Comes alone, gets very defensive when asked if family would join her for a visit. 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  Lives alone. Does not drive.   Has been out of blood pressure medication for several days.  This is her only prescription medication.   Review of Systems:  Review of Systems  Constitutional: Negative for chills, fever and malaise/fatigue.  HENT: Negative for hearing loss.   Eyes: Negative for blurred vision.  Respiratory: Negative for cough and shortness of breath.   Cardiovascular: Negative for chest pain and leg swelling.  Gastrointestinal: Negative for abdominal pain and constipation.  Genitourinary: Negative for dysuria, frequency and urgency.  Musculoskeletal: Negative for back pain, falls, joint pain, myalgias and neck pain.  Skin: Negative for itching and rash.  Neurological: Negative for dizziness, loss of consciousness and weakness.  Endo/Heme/Allergies: Does not bruise/bleed easily.  Psychiatric/Behavioral: Positive for memory loss. Negative for depression.    Past Medical History:  Diagnosis Date  . Heart murmur   . Hypertension    Past Surgical History:  Procedure Laterality Date  . ABDOMINAL  HYSTERECTOMY  1973   with BSO-Fibroids  . lymphectomy Right 1973   Breast- Benign  . TONSILLECTOMY    . WISDOM TOOTH EXTRACTION     Social History:   reports that  has never smoked. she has never used smokeless tobacco. She reports that she drinks alcohol. She reports that she uses drugs.  Family History  Problem Relation Age of Onset  . Heart attack Father   . Hearing loss Father   . Cancer Mother     Medications:   Medication List        Accurate as of 05/12/17 10:09 AM. Always use your most recent med list.          Tdap 5-2.5-18.5 LF-MCG/0.5 injection Commonly known as:  BOOSTRIX Inject 0.5 mLs into the muscle once for 1 dose.   valsartan-hydrochlorothiazide 320-12.5 MG tablet Commonly known as:  DIOVAN-HCT Take 1 tablet by mouth daily.   vitamin B-12 1000 MCG tablet Commonly known as:  CYANOCOBALAMIN   vitamin C 1000 MG tablet   Vitamin D3 2000 units capsule   vitamin E 1000 UNIT capsule Generic drug:  vitamin E       Where to Get Your Medications    These medications were sent to [facility], [address]  [place], [place] [address]   Hours:  24-hours Phone:  [phone]   valsartan-hydrochlorothiazide 320-12.5 MG tablet   You can get these medications from any pharmacy   Bring a paper prescription  for each of these medications  Tdap 5-2.5-18.5 LF-MCG/0.5 injection      Physical Exam:  Vitals:   05/12/17 0917  BP: (!) 144/86  Pulse: 63  Resp: 17  Temp: 98.4 F (36.9 C)  TempSrc: Oral  SpO2: 97%  Weight: 183 lb 6.4 oz (83.2 kg)  Height: '5\' 7"'  (1.702 m)   Body mass index is 28.72 kg/m.  Physical Exam  Constitutional: She appears well-developed and well-nourished. No distress.  HENT:  Head: Normocephalic and atraumatic.  Mouth/Throat: Oropharynx is clear and moist. No oropharyngeal exudate.  Eyes: Conjunctivae are normal. Pupils are  equal, round, and reactive to light.  Neck: Normal range of motion. Neck supple.  Cardiovascular: Normal rate and regular rhythm.  Murmur heard. Pulmonary/Chest: Effort normal and breath sounds normal. No respiratory distress.  Abdominal: Soft. Bowel sounds are normal. She exhibits no distension.  Musculoskeletal: She exhibits no edema or tenderness.  Neurological: She is alert. No cranial nerve deficit.  Forgetful  Skin: Skin is warm and dry. She is not diaphoretic.  Psychiatric: She has a normal mood and affect.    Labs reviewed: Basic Metabolic Panel: No results for input(s): NA, K, CL, CO2, GLUCOSE, BUN, CREATININE, CALCIUM, MG, PHOS, TSH in the last 8760 hours. Liver Function Tests: No results for input(s): AST, ALT, ALKPHOS, BILITOT, PROT, ALBUMIN in the last 8760 hours. No results for input(s): LIPASE, AMYLASE in the last 8760 hours. No results for input(s): AMMONIA in the last 8760 hours. CBC: No results for input(s): WBC, NEUTROABS, HGB, HCT, MCV, PLT in the last 8760 hours. Lipid Panel: No results for input(s): CHOL, HDL, LDLCALC, TRIG, CHOLHDL, LDLDIRECT in the last 8760 hours. TSH: No results for input(s): TSH in the last 8760 hours. A1C: No results found for: HGBA1C   Assessment/Plan 1. Essential hypertension -off all medication. Refills provided today, to cont medication and encouraged dash diet.  - CBC with Differential/Platelets - CMP with eGFR - Lipid Panel  2. Memory loss -does not feel like this is an issue. TSH was normal last year. Does not wish to bring family to visit and very adamant about this.  - CT Head Wo Contrast; Future for further evaluation - RPR  3. Need for vaccination with 13-polyvalent pneumococcal conjugate vaccine - Pneumococcal conjugate vaccine 13-valent  Next appt: needs AWV scheduled, 3 month routine follow up [place]. Harle Battiest  [facility] [phone] 8 am - 5 pm) [phone]  (after hours)

## 2017-05-14 LAB — CBC WITH DIFFERENTIAL/PLATELET
BASOS PCT: 0.5 %
Basophils Absolute: 32 cells/uL (ref 0–200)
EOS PCT: 5.7 %
Eosinophils Absolute: 359 cells/uL (ref 15–500)
HEMATOCRIT: 34.9 % — AB (ref 35.0–45.0)
Hemoglobin: 11.3 g/dL — ABNORMAL LOW (ref 11.7–15.5)
LYMPHS ABS: 2381 {cells}/uL (ref 850–3900)
MCH: 27 pg (ref 27.0–33.0)
MCHC: 32.4 g/dL (ref 32.0–36.0)
MCV: 83.3 fL (ref 80.0–100.0)
MPV: 12.3 fL (ref 7.5–12.5)
Monocytes Relative: 7.3 %
Neutro Abs: 3068 cells/uL (ref 1500–7800)
Neutrophils Relative %: 48.7 %
Platelets: 178 10*3/uL (ref 140–400)
RBC: 4.19 10*6/uL (ref 3.80–5.10)
RDW: 14.9 % (ref 11.0–15.0)
Total Lymphocyte: 37.8 %
WBC: 6.3 10*3/uL (ref 3.8–10.8)
WBCMIX: 460 {cells}/uL (ref 200–950)

## 2017-05-14 LAB — COMPLETE METABOLIC PANEL WITH GFR
AG RATIO: 1.3 (calc) (ref 1.0–2.5)
ALT: 9 U/L (ref 6–29)
AST: 16 U/L (ref 10–35)
Albumin: 4.3 g/dL (ref 3.6–5.1)
Alkaline phosphatase (APISO): 65 U/L (ref 33–130)
BUN: 12 mg/dL (ref 7–25)
CALCIUM: 9.7 mg/dL (ref 8.6–10.4)
CO2: 31 mmol/L (ref 20–32)
CREATININE: 0.85 mg/dL (ref 0.60–0.88)
Chloride: 105 mmol/L (ref 98–110)
GFR, EST AFRICAN AMERICAN: 74 mL/min/{1.73_m2} (ref >=60)
GFR, EST NON AFRICAN AMERICAN: 64 mL/min/{1.73_m2} (ref >=60)
GLOBULIN: 3.4 g/dL (ref 1.9–3.7)
Glucose, Bld: 84 mg/dL (ref 65–99)
POTASSIUM: 4.1 mmol/L (ref 3.5–5.3)
SODIUM: 143 mmol/L (ref 135–146)
TOTAL PROTEIN: 7.7 g/dL (ref 6.1–8.1)
Total Bilirubin: 0.8 mg/dL (ref 0.2–1.2)

## 2017-05-14 LAB — LIPID PANEL
CHOL/HDL RATIO: 3.2 (calc) (ref <5.0)
Cholesterol: 165 mg/dL (ref <200)
HDL: 52 mg/dL (ref >50)
LDL Cholesterol (Calc): 94 mg/dL (calc)
NON-HDL CHOLESTEROL (CALC): 113 mg/dL (ref <130)
TRIGLYCERIDES: 96 mg/dL (ref <150)

## 2017-05-14 LAB — TEST AUTHORIZATION

## 2017-05-14 LAB — FERRITIN: FERRITIN: 112 ng/mL (ref 20–288)

## 2017-05-15 ENCOUNTER — Other Ambulatory Visit: Payer: Self-pay | Admitting: Nurse Practitioner

## 2017-05-15 ENCOUNTER — Telehealth: Payer: Self-pay

## 2017-05-15 NOTE — Telephone Encounter (Signed)
Called patient to inform her of CT scheduled for Monday 05/18/17. I was unable to reach patient, phone rung continuously, no voicemail set up.  I called patient's daughter on home number and recording stated number is not a working number.  I called patient's daughter on mobile phone and left message with appt details: [facility]  [address]  Appt Time: 1:40 pm, arrive by 1:20 pm  No diet restrictions, patient to bring insurance card  I requested a return call to confirm message received.

## 2017-05-15 NOTE — Telephone Encounter (Signed)
Spoke with patient, patient verbalized understanding of appointment date and time.  [facility] was [facility] the [facility] to [facility] in the event that she can not make the appointment   Patient did express that she is not appalled by going to all these different appointments and stuff for she is a senior citizen that does not deive yet she intends on getting CT

## 2017-05-18 ENCOUNTER — Other Ambulatory Visit: Payer: Medicare HMO

## 2017-05-19 ENCOUNTER — Telehealth: Payer: Self-pay | Admitting: Nurse Practitioner

## 2017-05-19 NOTE — Telephone Encounter (Signed)
I called the patient to remind her of her appt tomorrow.  There was no answer and no option to leave a message. VDM (DD)

## 2017-05-20 ENCOUNTER — Ambulatory Visit (INDEPENDENT_AMBULATORY_CARE_PROVIDER_SITE_OTHER): Payer: Medicare HMO

## 2017-05-20 VITALS — BP 140/60 | HR 77 | Temp 98.1°F | Ht 67.0 in | Wt 186.0 lb

## 2017-05-20 DIAGNOSIS — Z0000 Encounter for general adult medical examination without abnormal findings: Secondary | ICD-10-CM | POA: Diagnosis not present

## 2017-05-20 NOTE — Progress Notes (Signed)
Subjective:   Kristina Randall is a 83 y.o. female who presents for Medicare Annual (Subsequent) preventive examination  Last AWV-01/29/2016    Objective:     Vitals: BP 140/60 (BP Location: Right Arm, Patient Position: Sitting)   Pulse 77   Temp 98.1 F (36.7 C) (Oral)   Ht 5\' 7"  (1.702 m)   Wt 186 lb (84.4 kg)   SpO2 96%   BMI 29.13 kg/m   Body mass index is 29.13 kg/m.  Advanced Directives 05/20/2017 05/12/2017 04/28/2016 01/29/2016 01/01/2016  Does Patient Have a Medical Advance Directive? Yes No No No No  Type of Advance Directive Healthcare Power of Attorney - - - -  Does patient want to make changes to medical advance directive? No - Patient declined - - - -  Copy of Healthcare Power of Attorney in Chart? No - copy requested - - - -  Would patient like information on creating a medical advance directive? - No - Patient declined Yes - Transport plannerducational materials given Yes - Transport plannerducational materials given No - patient declined information    Tobacco Social History   Tobacco Use  Smoking Status Never Smoker  Smokeless Tobacco Never Used     Counseling given: Not Answered   Clinical Intake:  Pre-visit preparation completed: No  Pain : No/denies pain     Nutritional Status: BMI 25 -29 Overweight Nutritional Risks: None Diabetes: No  Activities of Daily Living: Independent Ambulation: Independent with device- listed below Home Assistive Devices/Equipment: Eyeglasses Medication Administration: Independent Home Management: Independent  Barriers to Care Management & Learning: None  Do you feel unsafe in your current relationship?: No Do you feel physically threatened by others?: No Anyone hurting you at home, work, or school?: No Unable to ask?: No Information provided on Community resources: No  How often do you need to have someone help you when you read instructions, pamphlets, or other written materials from your doctor or pharmacy?: 1 - Never What is the last grade  level you completed in school?: Associates  Interpreter Needed?: No  Information entered by :: Annetta MawSara Gonthier, RN  Past Medical History:  Diagnosis Date  . Heart murmur   . Hypertension    Past Surgical History:  Procedure Laterality Date  . ABDOMINAL HYSTERECTOMY  1973   with BSO-Fibroids  . lymphectomy Right 1973   Breast- Benign  . TONSILLECTOMY    . WISDOM TOOTH EXTRACTION     Family History  Problem Relation Age of Onset  . Heart attack Father   . Hearing loss Father   . Cancer Mother    Social History   Socioeconomic History  . Marital status: Legally Separated    Spouse name: None  . Number of children: None  . Years of education: None  . Highest education level: None  Social Needs  . Financial resource strain: Not hard at all  . Food insecurity - worry: Never true  . Food insecurity - inability: Never true  . Transportation needs - medical: No  . Transportation needs - non-medical: No  Occupational History  . None  Tobacco Use  . Smoking status: Never Smoker  . Smokeless tobacco: Never Used  Substance and Sexual Activity  . Alcohol use: Yes    Frequency: Never    Comment: occasional drink  . Drug use: Yes  . Sexual activity: Not Currently  Other Topics Concern  . None  Social History Narrative   Diet?      Do you drink/eat things with  caffeine? Yes      Marital status?           divorced                         What year were you married? 1954      Do you live in a house, apartment, assisted living, condo, trailer, etc.? apartment      Is it one or more stories? yes      How many persons live in your home? 1      Do you have any pets in your home? (please list) no      Current or past profession: Retail buyer and sales      Do you exercise?        yes                              Type & how often? Walking and gym      Do you have a living will? no      Do you have a DNR form?                    ?              If not, do you want to  discuss one? yes      Do you have signed POA/HPOA for forms?  ?       Outpatient Encounter Medications as of 05/20/2017  Medication Sig  . Ascorbic Acid (VITAMIN C) 1000 MG tablet Take 1,000 mg by mouth daily.  . Cholecalciferol (VITAMIN D3) 2000 units capsule Take 2,000 Units by mouth daily. For supplement  . valsartan-hydrochlorothiazide (DIOVAN-HCT) 320-12.5 MG tablet Take 1 tablet by mouth daily.  . vitamin B-12 (CYANOCOBALAMIN) 1000 MCG tablet Take 1,000 mcg by mouth daily.  . vitamin E (VITAMIN E) 1000 UNIT capsule Take 1,000 Units by mouth daily.   No facility-administered encounter medications on file as of 05/20/2017.     Activities of Daily Living In your present state of health, do you have any difficulty performing the following activities: 05/20/2017  Hearing? N  Vision? N  Difficulty concentrating or making decisions? N  Walking or climbing stairs? N  Dressing or bathing? N  Doing errands, shopping? N  Preparing Food and eating ? N  Using the Toilet? N  In the past six months, have you accidently leaked urine? N  Do you have problems with loss of bowel control? N  Managing your Medications? N  Managing your Finances? N  Housekeeping or managing your Housekeeping? N  Some recent data might be hidden    Timed Get Up and Go performed: 13 seconds, within normal limits  Patient Care Team: Sharon Seller, NP as PCP - General (Geriatric Medicine)    Assessment:     Exercise Activities and Dietary recommendations Current Exercise Habits: Home exercise routine, Type of exercise: walking;stretching, Time (Minutes): 20, Frequency (Times/Week): 3, Weekly Exercise (Minutes/Week): 60, Intensity: Mild, Exercise limited by: None identified  Goals    None     Fall Risk Fall Risk  05/20/2017 05/12/2017 04/28/2016 01/29/2016 01/01/2016  Falls in the past year? No No No No No   Is the patient's home free of loose throw rugs in walkways, pet beds, electrical cords, etc?    yes      Grab bars in the bathroom? yes  Handrails on the stairs?   yes      Adequate lighting?   yes  Depression Screen PHQ 2/9 Scores 05/20/2017 05/12/2017 04/28/2016 01/29/2016  PHQ - 2 Score 0 0 0 0     Cognitive Function MMSE - Mini Mental State Exam 05/20/2017 04/28/2016 01/01/2016  Orientation to time 2 5 5   Orientation to Place 4 3 5   Registration 3 3 3   Attention/ Calculation 5 5 5   Recall 0 0 0  Language- name 2 objects 2 2 2   Language- repeat 1 1 1   Language- follow 3 step command 3 3 3   Language- read & follow direction 1 1 1   Write a sentence 1 1 1   Copy design 1 0 1  Total score 23 24 27         Immunization History  Administered Date(s) Administered  . Influenza, High Dose Seasonal PF 03/30/2017  . Influenza,inj,Quad PF,6+ Mos 04/28/2016  . Pneumococcal Conjugate-13 05/12/2017   Screening Tests Health Maintenance  Topic Date Due  . TETANUS/TDAP  07/16/1952  . PNA vac Low Risk Adult (2 of 2 - PPSV23) 05/12/2018  . INFLUENZA VACCINE  Completed  . DEXA SCAN  Completed   Cancer Screenings: Lung: Low Dose CT Chest recommended if Age 107-80 years, 30 pack-year currently smoking OR have quit w/in 15years. Patient does not qualify. Breast:  Up to date on Mammogram? Yes  Up to date of Bone Density/Dexa? Yes Colorectal: up to date, over age 81  Additional Screenings:  Hepatitis B/HIV/Syphillis:Declined Hepatitis C Screening: Declined     Plan:    I have personally reviewed and addressed the Medicare Annual Wellness questionnaire and have noted the following in the patient's chart:  A. Medical and social history B. Use of alcohol, tobacco or illicit drugs  C. Current medications and supplements D. Functional ability and status E.  Nutritional status F.  Physical activity G. Advance directives H. List of other physicians I.  Hospitalizations, surgeries, and ER visits in previous 12 months J.  Vitals K. Screenings to include hearing, vision, cognitive,  depression L. Referrals and appointments - none  In addition, I have reviewed and discussed with patient certain preventive protocols, quality metrics, and best practice recommendations. A written personalized care plan for preventive services as well as general preventive health recommendations were provided to patient.  See attached scanned questionnaire for additional information.   Signed,   Annetta MawSara Gonthier, RN Nurse Health Advisor   Quick Notes   Health Maintenance: Shingrix and tdap declined.     Abnormal Screen: MMSE 23/30. Did not pass clock      Patient Concerns: None     Nurse Concerns: None

## 2017-05-20 NOTE — Patient Instructions (Signed)
Kristina Randall , Thank you for taking time to come for your Medicare Wellness Visit. I appreciate your ongoing commitment to your health goals. Please review the following plan we discussed and let me know if I can assist you in the future.   Screening recommendations/referrals: Colonoscopy excluded, you are over age 81 Mammogram excluded, you are over age 81 Bone Density up to date Recommended yearly ophthalmology/optometry visit for glaucoma screening and checkup Recommended yearly dental visit for hygiene and checkup  Vaccinations: Influenza vaccine up to date. Due 2019 fall season Pneumococcal vaccine up to date. Due 05/12/2018 Tdap vaccine due, declined Shingles vaccine due, declined for now   Advanced directives: Please bring us a copy of your living will and health care power of attorney  Conditions/risks identified: none  Next appointment: Kristina ChattersJessica Eubanks, NP 08/13/2017 @ 9:45am   Preventive Care 65 Years and Older, Female Preventive care refers to lifestyle choices and visits with your health care provider that can promote health and wellness. What does preventive care include?  A yearly physical exam. This is also called an annual well check.  Dental exams once or twice a year.  Routine eye exams. Ask your health care provider how often you should have your eyes checked.  Personal lifestyle choices, including:  Daily care of your teeth and gums.  Regular physical activity.  Eating a healthy diet.  Avoiding tobacco and drug use.  Limiting alcohol use.  Practicing safe sex.  Taking low-dose aspirin every day.  Taking vitamin and mineral supplements as recommended by your health care provider. What happens during an annual well check? The services and screenings done by your health care provider during your annual well check will depend on your age, overall health, lifestyle risk factors, and family history of disease. Counseling  Your health care provider may  ask you questions about your:  Alcohol use.  Tobacco use.  Drug use.  Emotional well-being.  Home and relationship well-being.  Sexual activity.  Eating habits.  History of falls.  Memory and ability to understand (cognition).  Work and work Astronomerenvironment.  Reproductive health. Screening  You may have the following tests or measurements:  Height, weight, and BMI.  Blood pressure.  Lipid and cholesterol levels. These may be checked every 5 years, or more frequently if you are over 81 years old.  Skin check.  Lung cancer screening. You may have this screening every year starting at age 81 if you have a 30-pack-year history of smoking and currently smoke or have quit within the past 15 years.  Fecal occult blood test (FOBT) of the stool. You may have this test every year starting at age 550.  Flexible sigmoidoscopy or colonoscopy. You may have a sigmoidoscopy every 5 years or a colonoscopy every 10 years starting at age 81.  Hepatitis C blood test.  Hepatitis B blood test.  Sexually transmitted disease (STD) testing.  Diabetes screening. This is done by checking your blood sugar (glucose) after you have not eaten for a while (fasting). You may have this done every 1-3 years.  Bone density scan. This is done to screen for osteoporosis. You may have this done starting at age 81.  Mammogram. This may be done every 1-2 years. Talk to your health care provider about how often you should have regular mammograms. Talk with your health care provider about your test results, treatment options, and if necessary, the need for more tests. Vaccines  Your health care provider may recommend certain vaccines, such  as:  Influenza vaccine. This is recommended every year.  Tetanus, diphtheria, and acellular pertussis (Tdap, Td) vaccine. You may need a Td booster every 10 years.  Zoster vaccine. You may need this after age 14.  Pneumococcal 13-valent conjugate (PCV13) vaccine. One  dose is recommended after age 49.  Pneumococcal polysaccharide (PPSV23) vaccine. One dose is recommended after age 49. Talk to your health care provider about which screenings and vaccines you need and how often you need them. This information is not intended to replace advice given to you by your health care provider. Make sure you discuss any questions you have with your health care provider. Document Released: 06/29/2015 Document Revised: 02/20/2016 Document Reviewed: 04/03/2015 Elsevier Interactive Patient Education  2017 [place] Prevention in the Home Falls can cause injuries. They can happen to people of all ages. There are many things you can do to make your home safe and to help prevent falls. What can I do on the outside of my home?  Regularly fix the edges of walkways and driveways and fix any cracks.  Remove anything that might make you trip as you walk through a door, such as a raised step or threshold.  Trim any bushes or trees on the path to your home.  Use bright outdoor lighting.  Clear any walking paths of anything that might make someone trip, such as rocks or tools.  Regularly check to see if handrails are loose or broken. Make sure that both sides of any steps have handrails.  Any raised decks and porches should have guardrails on the edges.  Have any leaves, snow, or ice cleared regularly.  Use sand or salt on walking paths during winter.  Clean up any spills in your garage right away. This includes oil or grease spills. What can I do in the bathroom?  Use night lights.  Install grab bars by the toilet and in the tub and shower. Do not use towel bars as grab bars.  Use non-skid mats or decals in the tub or shower.  If you need to sit down in the shower, use a plastic, non-slip stool.  Keep the floor dry. Clean up any water that spills on the floor as soon as it happens.  Remove soap buildup in the tub or shower regularly.  Attach bath  mats securely with double-sided non-slip rug tape.  Do not have throw rugs and other things on the floor that can make you trip. What can I do in the bedroom?  Use night lights.  Make sure that you have a light by your bed that is easy to reach.  Do not use any sheets or blankets that are too big for your bed. They should not hang down onto the floor.  Have a firm chair that has side arms. You can use this for support while you get dressed.  Do not have throw rugs and other things on the floor that can make you trip. What can I do in the kitchen?  Clean up any spills right away.  Avoid walking on wet floors.  Keep items that you use a lot in easy-to-reach places.  If you need to reach something above you, use a strong step stool that has a grab bar.  Keep electrical cords out of the way.  Do not use floor polish or wax that makes floors slippery. If you must use wax, use non-skid floor wax.  Do not have throw rugs and other things on the  floor that can make you trip. What can I do with my stairs?  Do not leave any items on the stairs.  Make sure that there are handrails on both sides of the stairs and use them. Fix handrails that are broken or loose. Make sure that handrails are as long as the stairways.  Check any carpeting to make sure that it is firmly attached to the stairs. Fix any carpet that is loose or worn.  Avoid having throw rugs at the top or bottom of the stairs. If you do have throw rugs, attach them to the floor with carpet tape.  Make sure that you have a light switch at the top of the stairs and the bottom of the stairs. If you do not have them, ask someone to add them for you. What else can I do to help prevent falls?  Wear shoes that:  Do not have high heels.  Have rubber bottoms.  Are comfortable and fit you well.  Are closed at the toe. Do not wear sandals.  If you use a stepladder:  Make sure that it is fully opened. Do not climb a closed  stepladder.  Make sure that both sides of the stepladder are locked into place.  Ask someone to hold it for you, if possible.  Clearly mark and make sure that you can see:  Any grab bars or handrails.  First and last steps.  Where the edge of each step is.  Use tools that help you move around (mobility aids) if they are needed. These include:  Canes.  Walkers.  Scooters.  Crutches.  Turn on the lights when you go into a dark area. Replace any light bulbs as soon as they burn out.  Set up your furniture so you have a clear path. Avoid moving your furniture around.  If any of your floors are uneven, fix them.  If there are any pets around you, be aware of where they are.  Review your medicines with your doctor. Some medicines can make you feel dizzy. This can increase your chance of falling. Ask your doctor what other things that you can do to help prevent falls. This information is not intended to replace advice given to you by your health care provider. Make sure you discuss any questions you have with your health care provider. Document Released: 03/29/2009 Document Revised: 11/08/2015 Document Reviewed: 07/07/2014 Elsevier Interactive Patient Education  2017 [employer].

## 2017-07-14 ENCOUNTER — Other Ambulatory Visit: Payer: Self-pay | Admitting: Nurse Practitioner

## 2017-07-14 MED ORDER — VALSARTAN-HYDROCHLOROTHIAZIDE 320-12.5 MG PO TABS: ORAL_TABLET | ORAL | 0 refills | Status: DC

## 2017-07-14 NOTE — Telephone Encounter (Signed)
[facility] called requesting #[phone] refill for patient and stated that patient also needed #[phone] faxed to local pharmacy while waiting for mail order. Faxed.

## 2017-08-13 ENCOUNTER — Encounter: Payer: Self-pay | Admitting: Nurse Practitioner

## 2017-08-13 ENCOUNTER — Ambulatory Visit: Payer: Medicare HMO | Admitting: Nurse Practitioner

## 2017-08-13 VITALS — BP 140/82 | HR 60 | Temp 97.9°F | Ht 67.0 in | Wt 184.0 lb

## 2017-08-13 DIAGNOSIS — R011 Cardiac murmur, unspecified: Secondary | ICD-10-CM | POA: Diagnosis not present

## 2017-08-13 DIAGNOSIS — I10 Essential (primary) hypertension: Secondary | ICD-10-CM | POA: Diagnosis not present

## 2017-08-13 DIAGNOSIS — R413 Other amnesia: Secondary | ICD-10-CM

## 2017-08-13 NOTE — Progress Notes (Signed)
Careteam: Patient Care Team: Sharon SellerEubanks, Yi Haugan K, NP as PCP - General (Geriatric Medicine)  Advanced Directive information    No Known Allergies  Chief Complaint  Patient presents with  . Medical Management of Chronic Issues    3 month follow-up on HTN and memory   . Orders    Discuss CT of Head orderd in November 2018 (not perforemed), patient not sure wht this was ordered and states no one discussed a CT with her     HPI: Patient is a 84 y.o. female seen in the office today for routine follow up. Reports she does not have anything to complain about health wise.   HTN- pt continues on valsartan- HCTZ, reports medication compliance.   Memory- discussed CT for screening and due to worsening memory loss based on MMSE which we have been following. Pt does not wish to have CT scan of head.   Very health oriented reports she is "fine" no health issues, takes her supplements that keeps her healthy.  Reports she pays her bills.  Reports she does not have a timer of how far she walks but states its "blocks and blocks" Eats healthy, limits sodium and sweets.   Review of Systems:  Review of Systems  Constitutional: Negative for chills, fever and malaise/fatigue.  HENT: Negative for hearing loss.   Eyes: Negative for blurred vision.  Respiratory: Negative for cough and shortness of breath.   Cardiovascular: Negative for chest pain and leg swelling.  Gastrointestinal: Negative for abdominal pain and constipation.  Genitourinary: Negative for dysuria, frequency and urgency.  Musculoskeletal: Negative for back pain, falls, joint pain, myalgias and neck pain.  Skin: Negative for itching and rash.  Neurological: Negative for dizziness, loss of consciousness, weakness and headaches.  Endo/Heme/Allergies: Does not bruise/bleed easily.  Psychiatric/Behavioral: Positive for memory loss. Negative for depression. The patient does not have insomnia.     Past Medical History:  Diagnosis  Date  . Heart murmur   . Hypertension    Past Surgical History:  Procedure Laterality Date  . ABDOMINAL HYSTERECTOMY  1973   with BSO-Fibroids  . lymphectomy Right 1973   Breast- Benign  . TONSILLECTOMY    . WISDOM TOOTH EXTRACTION     Social History:   reports that  has never smoked. she has never used smokeless tobacco. She reports that she drinks alcohol. She reports that she does not use drugs.  Family History  Problem Relation Age of Onset  . Heart attack Father   . Hearing loss Father   . Cancer Mother     Medications: Patient's Medications  New Prescriptions   No medications on file  Previous Medications   ASCORBIC ACID (VITAMIN C) 1000 MG TABLET    Take 1,000 mg by mouth daily.   CHOLECALCIFEROL (VITAMIN D3) 2000 UNITS CAPSULE    Take 2,000 Units by mouth daily. For supplement   VALSARTAN-HYDROCHLOROTHIAZIDE (DIOVAN-HCT) 320-12.5 MG TABLET    Take one tablet by mouth once daily   VITAMIN B-12 (CYANOCOBALAMIN) 1000 MCG TABLET    Take 1,000 mcg by mouth daily.   VITAMIN E (VITAMIN E) 1000 UNIT CAPSULE    Take 1,000 Units by mouth daily.  Modified Medications   No medications on file  Discontinued Medications   No medications on file     Physical Exam:  Vitals:   08/13/17 0941  BP: 140/82  Pulse: 60  Temp: 97.9 F (36.6 C)  TempSrc: Oral  SpO2: 97%  Weight: 184 lb (  83.5 kg)  Height: 5\' 7"  (1.702 m)   Body mass index is 28.82 kg/m.  Physical Exam  Constitutional: She appears well-developed and well-nourished. No distress.  HENT:  Head: Normocephalic and atraumatic.  Mouth/Throat: Oropharynx is clear and moist. No oropharyngeal exudate.  Eyes: Conjunctivae are normal. Pupils are equal, round, and reactive to light.  Neck: Normal range of motion. Neck supple.  Cardiovascular: Normal rate and regular rhythm.  Murmur heard. Pulmonary/Chest: Effort normal and breath sounds normal. No respiratory distress.  Abdominal: Soft. Bowel sounds are normal. She  exhibits no distension.  Musculoskeletal: Normal range of motion. She exhibits no edema or tenderness.  Neurological: She is alert. No cranial nerve deficit.  Forgetful Very defensive over memory  Skin: Skin is warm and dry. She is not diaphoretic.  Psychiatric: She has a normal mood and affect.   Labs reviewed: Basic Metabolic Panel: Recent Labs    05/12/17 1037  NA 143  K 4.1  CL 105  CO2 31  GLUCOSE 84  BUN 12  CREATININE 0.85  CALCIUM 9.7   Liver Function Tests: Recent Labs    05/12/17 1037  AST 16  ALT 9  BILITOT 0.8  PROT 7.7   No results for input(s): LIPASE, AMYLASE in the last 8760 hours. No results for input(s): AMMONIA in the last 8760 hours. CBC: Recent Labs    05/12/17 1037  WBC 6.3  NEUTROABS 3,068  HGB 11.3*  HCT 34.9*  MCV 83.3  PLT 178   Lipid Panel: Recent Labs    05/12/17 1037  CHOL 165  HDL 52  TRIG 96  CHOLHDL 3.2   TSH: No results for input(s): TSH in the last 8760 hours. A1C: No results found for: HGBA1C   Assessment/Plan 1. Essential hypertension -stable, will continue current regimen, low sodium diet encouraged.  - CBC with Differential/Platelets - COMPLETE METABOLIC PANEL WITH GFR  2. Memory loss -MMSE progressively getting worse over the last 3 years 12/18 23/30 - pt forgot that CT head was order for further evaluation and got very defensive but agreeable to get this done after extensive discussion today. Does not bring anyone with her to visit and does not feel like it is necessary.  - RPR - TSH - CT Head Wo Contrast; Future  3. Murmur, cardiac Hx of rheumatic fever - ECHOCARDIOGRAM COMPLETE; Future  Next appt: 4 months with Dr Raynelle Dick K. Biagio Borg  [facility] [phone] 8 am - 5 pm) [phone] (after hours)

## 2017-08-14 LAB — CBC WITH DIFFERENTIAL/PLATELET
Basophils Absolute: 34 cells/uL (ref 0–200)
Basophils Relative: 0.5 %
Eosinophils Absolute: 442 cells/uL (ref 15–500)
Eosinophils Relative: 6.6 %
HEMATOCRIT: 36.7 % (ref 35.0–45.0)
Hemoglobin: 12 g/dL (ref 11.7–15.5)
LYMPHS ABS: 2104 {cells}/uL (ref 850–3900)
MCH: 27 pg (ref 27.0–33.0)
MCHC: 32.7 g/dL (ref 32.0–36.0)
MCV: 82.5 fL (ref 80.0–100.0)
MPV: 11 fL (ref 7.5–12.5)
Monocytes Relative: 7.7 %
NEUTROS PCT: 53.8 %
Neutro Abs: 3605 cells/uL (ref 1500–7800)
Platelets: 253 10*3/uL (ref 140–400)
RBC: 4.45 10*6/uL (ref 3.80–5.10)
RDW: 15.1 % — AB (ref 11.0–15.0)
Total Lymphocyte: 31.4 %
WBC: 6.7 10*3/uL (ref 3.8–10.8)
WBCMIX: 516 {cells}/uL (ref 200–950)

## 2017-08-14 LAB — COMPLETE METABOLIC PANEL WITH GFR
AG RATIO: 1.3 (calc) (ref 1.0–2.5)
ALT: 10 U/L (ref 6–29)
AST: 16 U/L (ref 10–35)
Albumin: 4.4 g/dL (ref 3.6–5.1)
Alkaline phosphatase (APISO): 76 U/L (ref 33–130)
BUN / CREAT RATIO: 10 (calc) (ref 6–22)
BUN: 9 mg/dL (ref 7–25)
CALCIUM: 9.9 mg/dL (ref 8.6–10.4)
CO2: 32 mmol/L (ref 20–32)
Chloride: 105 mmol/L (ref 98–110)
Creat: 0.89 mg/dL — ABNORMAL HIGH (ref 0.60–0.88)
GFR, EST AFRICAN AMERICAN: 70 mL/min/{1.73_m2} (ref >=60)
GFR, EST NON AFRICAN AMERICAN: 60 mL/min/{1.73_m2} (ref >=60)
GLOBULIN: 3.3 g/dL (ref 1.9–3.7)
Glucose, Bld: 87 mg/dL (ref 65–99)
POTASSIUM: 4.3 mmol/L (ref 3.5–5.3)
SODIUM: 142 mmol/L (ref 135–146)
TOTAL PROTEIN: 7.7 g/dL (ref 6.1–8.1)
Total Bilirubin: 0.7 mg/dL (ref 0.2–1.2)

## 2017-08-14 LAB — TSH: TSH: 4.81 mIU/L — ABNORMAL HIGH (ref 0.40–4.50)

## 2017-08-14 LAB — RPR: RPR Ser Ql: NONREACTIVE

## 2017-08-25 ENCOUNTER — Other Ambulatory Visit: Payer: Medicare HMO

## 2017-09-02 ENCOUNTER — Ambulatory Visit
Admission: RE | Admit: 2017-09-02 | Discharge: 2017-09-02 | Disposition: A | Discharge status: Home / Self Care | Payer: Medicare HMO | Source: Ambulatory Visit | Attending: Nurse Practitioner | Admitting: Nurse Practitioner

## 2017-09-02 DIAGNOSIS — R413 Other amnesia: Secondary | ICD-10-CM

## 2017-10-21 ENCOUNTER — Telehealth: Payer: Self-pay | Admitting: *Deleted

## 2017-10-21 MED ORDER — VALSARTAN-HYDROCHLOROTHIAZIDE 320-12.5 MG PO TABS: ORAL_TABLET | ORAL | 3 refills | Status: DC

## 2017-10-21 NOTE — Telephone Encounter (Signed)
Patient called and left message on Clinical Intake stating that she needed a refill on her BP medication sent to mail order. Faxed.   Tried calling patient back twice and each time the phone rings and rings.

## 2017-11-10 ENCOUNTER — Ambulatory Visit (HOSPITAL_COMMUNITY): Payer: Medicare HMO | Attending: Cardiology

## 2017-11-10 ENCOUNTER — Other Ambulatory Visit: Payer: Self-pay

## 2017-11-10 DIAGNOSIS — R011 Cardiac murmur, unspecified: Secondary | ICD-10-CM

## 2017-11-10 DIAGNOSIS — I119 Hypertensive heart disease without heart failure: Secondary | ICD-10-CM | POA: Diagnosis not present

## 2017-11-10 DIAGNOSIS — I350 Nonrheumatic aortic (valve) stenosis: Secondary | ICD-10-CM | POA: Diagnosis not present

## 2017-12-07 ENCOUNTER — Encounter: Payer: Self-pay | Admitting: Internal Medicine

## 2017-12-07 ENCOUNTER — Ambulatory Visit (INDEPENDENT_AMBULATORY_CARE_PROVIDER_SITE_OTHER): Payer: Medicare HMO | Admitting: Internal Medicine

## 2017-12-07 VITALS — BP 120/70 | HR 63 | Temp 98.1°F | Ht 67.0 in | Wt 184.0 lb

## 2017-12-07 DIAGNOSIS — R011 Cardiac murmur, unspecified: Secondary | ICD-10-CM | POA: Diagnosis not present

## 2017-12-07 DIAGNOSIS — I10 Essential (primary) hypertension: Secondary | ICD-10-CM

## 2017-12-07 DIAGNOSIS — R413 Other amnesia: Secondary | ICD-10-CM

## 2017-12-07 DIAGNOSIS — Z8679 Personal history of other diseases of the circulatory system: Secondary | ICD-10-CM | POA: Diagnosis not present
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
## 2017-12-07 NOTE — Patient Instructions (Signed)
Keep up the good work with your exercise!

## 2018-04-07 ENCOUNTER — Ambulatory Visit: Payer: Medicare HMO | Admitting: Nurse Practitioner

## 2018-04-12 ENCOUNTER — Ambulatory Visit (INDEPENDENT_AMBULATORY_CARE_PROVIDER_SITE_OTHER): Payer: Medicare HMO | Admitting: Nurse Practitioner

## 2018-04-12 ENCOUNTER — Encounter: Payer: Self-pay | Admitting: Nurse Practitioner

## 2018-04-12 VITALS — BP 118/76 | HR 70 | Temp 98.5°F | Ht 67.0 in | Wt 183.6 lb

## 2018-04-12 DIAGNOSIS — G3184 Mild cognitive impairment, so stated: Secondary | ICD-10-CM

## 2018-04-12 DIAGNOSIS — E663 Overweight: Secondary | ICD-10-CM | POA: Diagnosis not present

## 2018-04-12 DIAGNOSIS — I10 Essential (primary) hypertension: Secondary | ICD-10-CM

## 2018-04-12 DIAGNOSIS — Z6828 Body mass index (BMI) 28.0-28.9, adult: Secondary | ICD-10-CM

## 2018-04-12 LAB — CBC WITH DIFFERENTIAL/PLATELET
Basophils Absolute: 32 cells/uL (ref 0–200)
Basophils Relative: 0.5 %
EOS ABS: 416 {cells}/uL (ref 15–500)
Eosinophils Relative: 6.5 %
HCT: 37.4 % (ref 35.0–45.0)
Hemoglobin: 12 g/dL (ref 11.7–15.5)
Lymphs Abs: 2042 cells/uL (ref 850–3900)
MCH: 27.3 pg (ref 27.0–33.0)
MCHC: 32.1 g/dL (ref 32.0–36.0)
MCV: 85.2 fL (ref 80.0–100.0)
MONOS PCT: 7.9 %
MPV: 11.2 fL (ref 7.5–12.5)
NEUTROS PCT: 53.2 %
Neutro Abs: 3405 cells/uL (ref 1500–7800)
PLATELETS: 246 10*3/uL (ref 140–400)
RBC: 4.39 10*6/uL (ref 3.80–5.10)
RDW: 14.8 % (ref 11.0–15.0)
TOTAL LYMPHOCYTE: 31.9 %
WBC: 6.4 10*3/uL (ref 3.8–10.8)
WBCMIX: 506 {cells}/uL (ref 200–950)

## 2018-04-12 LAB — BASIC METABOLIC PANEL WITH GFR
BUN: 15 mg/dL (ref 7–25)
CHLORIDE: 103 mmol/L (ref 98–110)
CO2: 31 mmol/L (ref 20–32)
Calcium: 10 mg/dL (ref 8.6–10.4)
Creat: 0.82 mg/dL (ref 0.60–0.88)
GFR, EST AFRICAN AMERICAN: 77 mL/min/{1.73_m2} (ref >=60)
GFR, Est Non African American: 67 mL/min/{1.73_m2} (ref >=60)
GLUCOSE: 88 mg/dL (ref 65–99)
POTASSIUM: 4.1 mmol/L (ref 3.5–5.3)
SODIUM: 141 mmol/L (ref 135–146)

## 2018-04-12 NOTE — Progress Notes (Signed)
Careteam: Patient Care Team: Lauree Chandler, NP as PCP - General (Geriatric Medicine)  Advanced Directive information Does Patient Have a Medical Advance Directive?: No, Would patient like information on creating a medical advance directive?: No - Patient declined  No Known Allergies  Chief Complaint  Patient presents with  . Medical Management of Chronic Issues    Pt is being seen for a 4 month routine visit.   . Audit C Screening    score of 1     HPI: Patient is a 84 y.o. female seen in the office today for routine follow up.  States she is in good health Walking at least 4 days a week. Walks for 30-45 mins. Goes up and down steps in her building.  Prepares meals- follows low fat diet.   Occasional allergies but does not need medication for this.   Had flu shot 2 weeks ago.   Review of Systems:  Review of Systems  Constitutional: Negative for chills, fever and malaise/fatigue.  HENT: Negative for hearing loss.   Eyes: Negative for blurred vision.  Respiratory: Negative for cough and shortness of breath.   Cardiovascular: Negative for chest pain and leg swelling.  Gastrointestinal: Negative for abdominal pain and constipation.  Genitourinary: Negative for dysuria, frequency and urgency.  Musculoskeletal: Negative for back pain, falls, joint pain, myalgias and neck pain.  Skin: Negative for itching and rash.  Neurological: Negative for dizziness, loss of consciousness, weakness and headaches.  Endo/Heme/Allergies: Does not bruise/bleed easily.  Psychiatric/Behavioral: Positive for memory loss. Negative for depression. The patient does not have insomnia.     Past Medical History:  Diagnosis Date  . Heart murmur   . Hypertension    Past Surgical History:  Procedure Laterality Date  . ABDOMINAL HYSTERECTOMY  1973   with BSO-Fibroids  . lymphectomy Right 1973   Breast- Benign  . TONSILLECTOMY    . WISDOM TOOTH EXTRACTION     Social History:   reports  that she has never smoked. She has never used smokeless tobacco. She reports that she drinks alcohol. She reports that she does not use drugs.  Family History  Problem Relation Age of Onset  . Heart attack Father   . Hearing loss Father   . Cancer Mother     Medications: Patient's Medications  New Prescriptions   No medications on file  Previous Medications   ASCORBIC ACID (VITAMIN C) 1000 MG TABLET    Take 1,000 mg by mouth daily.   CHOLECALCIFEROL (VITAMIN D3) 2000 UNITS CAPSULE    Take 2,000 Units by mouth daily. For supplement   VALSARTAN-HYDROCHLOROTHIAZIDE (DIOVAN-HCT) 320-12.5 MG TABLET    Take one tablet by mouth once daily   VITAMIN B-12 (CYANOCOBALAMIN) 1000 MCG TABLET    Take 1,000 mcg by mouth daily.   VITAMIN E (VITAMIN E) 1000 UNIT CAPSULE    Take 1,000 Units by mouth daily.  Modified Medications   No medications on file  Discontinued Medications   No medications on file     Physical Exam:  Vitals:   04/12/18 1022  BP: 118/76  Pulse: 70  Temp: 98.5 F (36.9 C)  SpO2: 98%  Weight: 183 lb 9.6 oz (83.3 kg)  Height: '5\' 7"'  (1.702 m)   Body mass index is 28.76 kg/m.  Physical Exam  Constitutional: She is oriented to person, place, and time. She appears well-developed and well-nourished. No distress.  HENT:  Head: Normocephalic and atraumatic.  Eyes: Pupils are equal, round, and  reactive to light.  Neck: Normal range of motion.  Cardiovascular: Normal rate, regular rhythm and intact distal pulses.  Murmur heard. Pulmonary/Chest: Effort normal and breath sounds normal. No respiratory distress. She has no wheezes.  Musculoskeletal: Normal range of motion.  Neurological: She is alert and oriented to person, place, and time.  repeats herself often during visit  Skin: Skin is warm and dry. Capillary refill takes less than 2 seconds.  Psychiatric: She has a normal mood and affect.    Labs reviewed: Basic Metabolic Panel: Recent Labs    05/12/17 1037  08/13/17 1027  NA 143 142  K 4.1 4.3  CL 105 105  CO2 31 32  GLUCOSE 84 87  BUN 12 9  CREATININE 0.85 0.89*  CALCIUM 9.7 9.9  TSH  --  4.81*   Liver Function Tests: Recent Labs    05/12/17 1037 08/13/17 1027  AST 16 16  ALT 9 10  BILITOT 0.8 0.7  PROT 7.7 7.7   No results for input(s): LIPASE, AMYLASE in the last 8760 hours. No results for input(s): AMMONIA in the last 8760 hours. CBC: Recent Labs    05/12/17 1037 08/13/17 1027  WBC 6.3 6.7  NEUTROABS 3,068 3,605  HGB 11.3* 12.0  HCT 34.9* 36.7  MCV 83.3 82.5  PLT 178 253   Lipid Panel: Recent Labs    05/12/17 1037  CHOL 165  HDL 52  LDLCALC 94  TRIG 96  CHOLHDL 3.2   TSH: Recent Labs    08/13/17 1027  TSH 4.81*   A1C: No results found for: HGBA1C   Assessment/Plan 1. Essential hypertension Controlled on current medication. Will continue current regimen with medication and lifestyle modifications.  - CBC with Differential/Platelets - BMP with eGFR([facility])  2. Mild cognitive impairment with memory loss Stable, comes alone to appt, has a lot of support with family. Does not drive.   3. Body mass index 28.0-28.9, adult Noted today.  4. Overweight (BMI 25.0-29.9) Pt cooks often, attempts to follow heart healthy diet and walks for exercise 4 days a week  Next appt: 6 months.  Carlos American. [place], [facility] [phone]

## 2018-04-12 NOTE — Patient Instructions (Signed)
Fat and Cholesterol Restricted Diet Getting too much fat and cholesterol in your diet may cause health problems. Following this diet helps keep your fat and cholesterol at normal levels. This can keep you from getting sick. What types of fat should I choose?  Choose monosaturated and polyunsaturated fats. These are found in foods such as olive oil, canola oil, flaxseeds, walnuts, almonds, and seeds.  Eat more omega-3 fats. Good choices include salmon, mackerel, sardines, tuna, flaxseed oil, and ground flaxseeds.  Limit saturated fats. These are in animal products such as meats, butter, and cream. They can also be in plant products such as palm oil, palm kernel oil, and coconut oil.  Avoid foods with partially hydrogenated oils in them. These contain trans fats. Examples of foods that have trans fats are stick margarine, some tub margarines, cookies, crackers, and other baked goods. What general guidelines do I need to follow?  Check food labels. Look for the words "trans fat" and "saturated fat."  When preparing a meal: ? Fill half of your plate with vegetables and green salads. ? Fill one fourth of your plate with whole grains. Look for the word "whole" as the first word in the ingredient list. ? Fill one fourth of your plate with lean protein foods.  Eat more foods that have fiber, like apples, carrots, beans, peas, and barley.  Eat more home-cooked foods. Eat less at restaurants and buffets.  Limit or avoid alcohol.  Limit foods high in starch and sugar.  Limit fried foods.  Cook foods without frying them. Baking, boiling, grilling, and broiling are all great options.  Lose weight if you are overweight. Losing even a small amount of weight can help your overall health. It can also help prevent diseases such as diabetes and heart disease. What foods can I eat? Grains Whole grains, such as whole wheat or whole grain breads, crackers, cereals, and pasta. Unsweetened oatmeal,  bulgur, barley, quinoa, or brown rice. Corn or whole wheat flour tortillas. Vegetables Fresh or frozen vegetables (raw, steamed, roasted, or grilled). Green salads. Fruits All fresh, canned (in natural juice), or frozen fruits. Meat and Other Protein Products Ground beef (85% or leaner), grass-fed beef, or beef trimmed of fat. Skinless chicken or turkey. Ground chicken or turkey. Pork trimmed of fat. All fish and seafood. Eggs. Dried beans, peas, or lentils. Unsalted nuts or seeds. Unsalted canned or dry beans. Dairy Low-fat dairy products, such as skim or 1% milk, 2% or reduced-fat cheeses, low-fat ricotta or cottage cheese, or plain low-fat yogurt. Fats and Oils Tub margarines without trans fats. Light or reduced-fat mayonnaise and salad dressings. Avocado. Olive, canola, sesame, or safflower oils. Natural peanut or almond butter (choose ones without added sugar and oil). The items listed above may not be a complete list of recommended foods or beverages. Contact your dietitian for more options. What foods are not recommended? Grains White bread. White pasta. White rice. Cornbread. Bagels, pastries, and croissants. Crackers that contain trans fat. Vegetables White potatoes. Corn. Creamed or fried vegetables. Vegetables in a cheese sauce. Fruits Dried fruits. Canned fruit in light or heavy syrup. Fruit juice. Meat and Other Protein Products Fatty cuts of meat. Ribs, chicken wings, bacon, sausage, bologna, salami, chitterlings, fatback, hot dogs, bratwurst, and packaged luncheon meats. Liver and organ meats. Dairy Whole or 2% milk, cream, half-and-half, and cream cheese. Whole milk cheeses. Whole-fat or sweetened yogurt. Full-fat cheeses. Nondairy creamers and whipped toppings. Processed cheese, cheese spreads, or cheese curds. Sweets and Desserts Corn   syrup, sugars, honey, and molasses. Candy. Jam and jelly. Syrup. Sweetened cereals. Cookies, pies, cakes, donuts, muffins, and ice  cream. Fats and Oils Butter, stick margarine, lard, shortening, ghee, or bacon fat. Coconut, palm kernel, or palm oils. Beverages Alcohol. Sweetened drinks (such as sodas, lemonade, and fruit drinks or punches). The items listed above may not be a complete list of foods and beverages to avoid. Contact your dietitian for more information. This information is not intended to replace advice given to you by your health care provider. Make sure you discuss any questions you have with your health care provider. Document Released: 12/02/2011 Document Revised: 02/07/2016 Document Reviewed: 09/01/2013 Elsevier Interactive Patient Education  2018 Elsevier Inc.  

## 2018-06-02 ENCOUNTER — Ambulatory Visit: Payer: Medicare HMO

## 2018-08-30 ENCOUNTER — Other Ambulatory Visit: Payer: Self-pay | Admitting: Internal Medicine

## 2018-10-10 ENCOUNTER — Encounter: Payer: Self-pay | Admitting: Nurse Practitioner

## 2018-10-11 ENCOUNTER — Other Ambulatory Visit: Payer: Self-pay

## 2018-10-11 ENCOUNTER — Encounter: Payer: Medicare HMO | Admitting: Nurse Practitioner

## 2018-10-11 ENCOUNTER — Ambulatory Visit: Payer: Medicare HMO | Admitting: Nurse Practitioner

## 2018-10-11 ENCOUNTER — Telehealth: Payer: Self-pay

## 2018-10-11 NOTE — Telephone Encounter (Signed)
Called patient in attempt to initiate tele visit the patient previously consented to.   Patient states she did not give consent today and will not be coming back to this practice. She plans to go to [facility] instead.  Will remove [place] from chart.

## 2018-10-12 NOTE — Progress Notes (Signed)
This encounter was created in error - please disregard.  This encounter was created in error - please disregard.

## 2018-10-25 ENCOUNTER — Other Ambulatory Visit: Payer: Self-pay | Admitting: Internal Medicine

## 2019-01-17 DIAGNOSIS — E559 Vitamin D deficiency, unspecified: Secondary | ICD-10-CM | POA: Diagnosis not present

## 2019-01-17 DIAGNOSIS — I10 Essential (primary) hypertension: Secondary | ICD-10-CM | POA: Diagnosis not present

## 2019-01-17 DIAGNOSIS — Z008 Encounter for other general examination: Secondary | ICD-10-CM | POA: Diagnosis not present

## 2019-04-03 IMAGING — CT CT HEAD W/O CM
1 series · 16 of 30 positions shown, 20 images · non-contrast
Comparison: None.

CLINICAL DATA: Memory loss.

EXAM:
CT HEAD WITHOUT CONTRAST
TECHNIQUE: Contiguous axial images were obtained from the base of the skull
through the vertex without intravenous contrast.

[Series 2: head w/(date) · axial · 0.43mm/px · z∈[+959,+1094]mm · 16 of 31 slices shown, 20 images]
[im 2/31  brain]
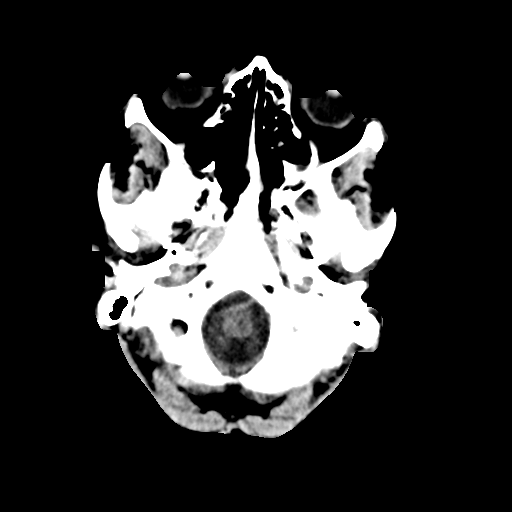
[im 2/31  bone]
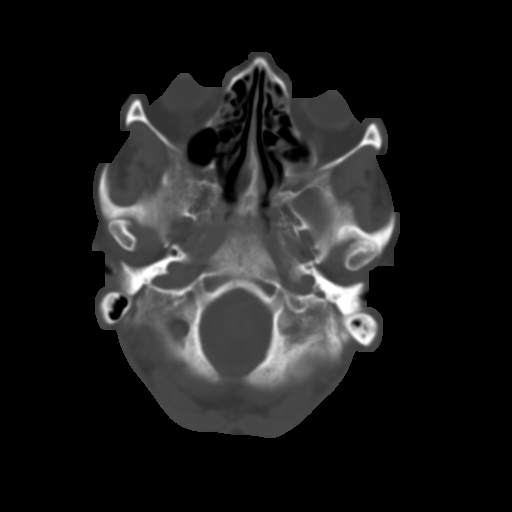
[im 4/31  brain]
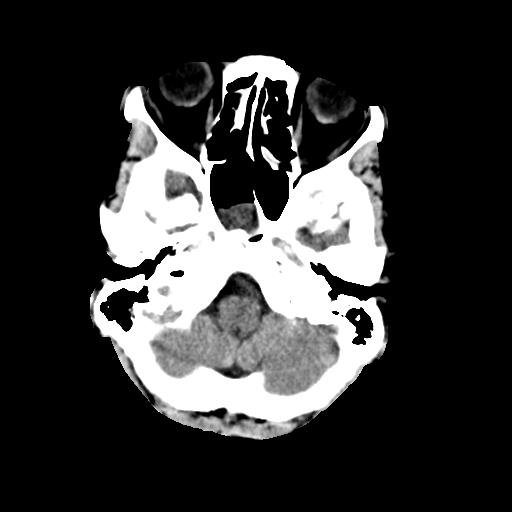
[im 6/31  brain]
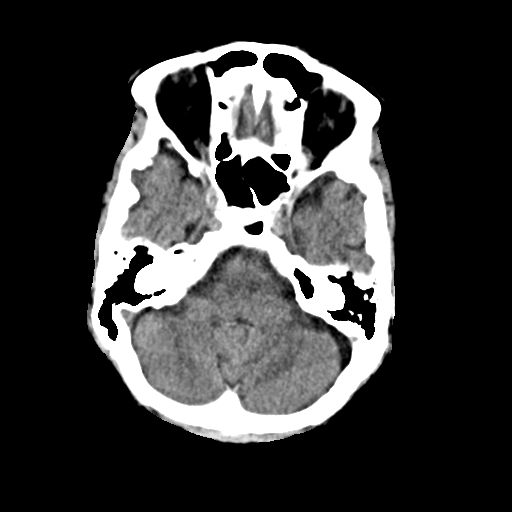
[im 8/31  brain]
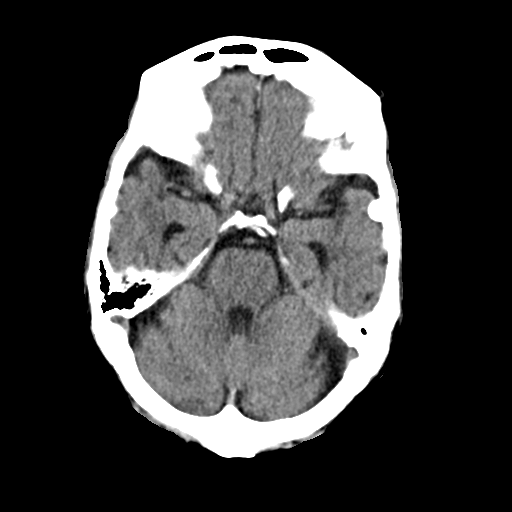
[im 9/31  brain]
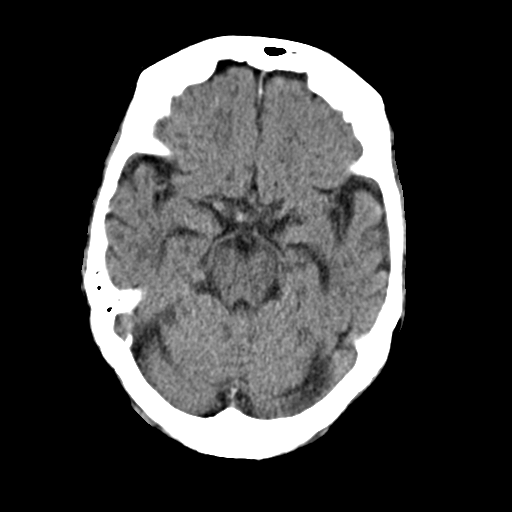
[im 9/31  bone]
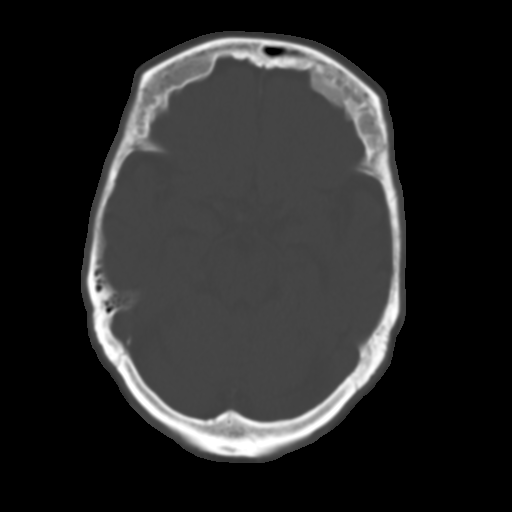
[im 11/31  brain]
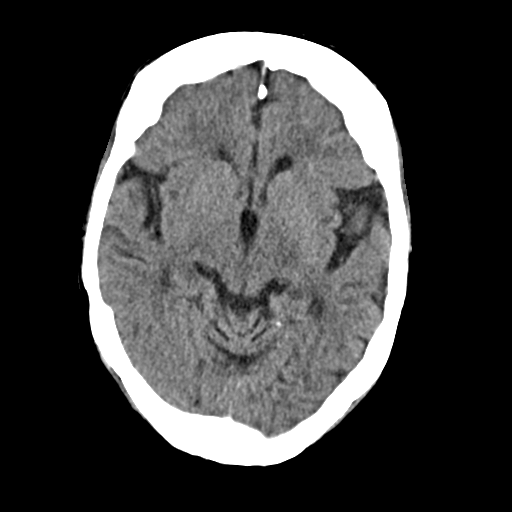
[im 13/31  brain]
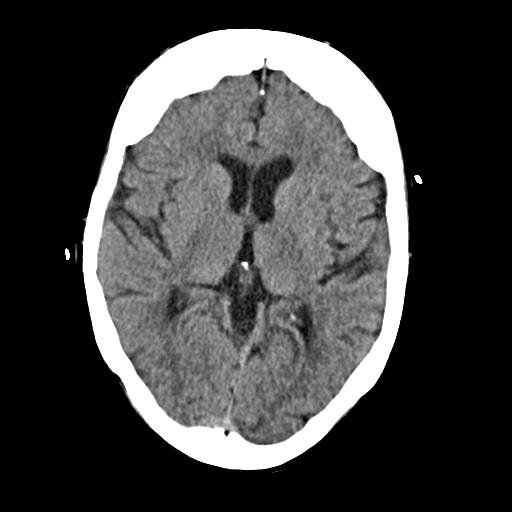
[im 15/31  brain]
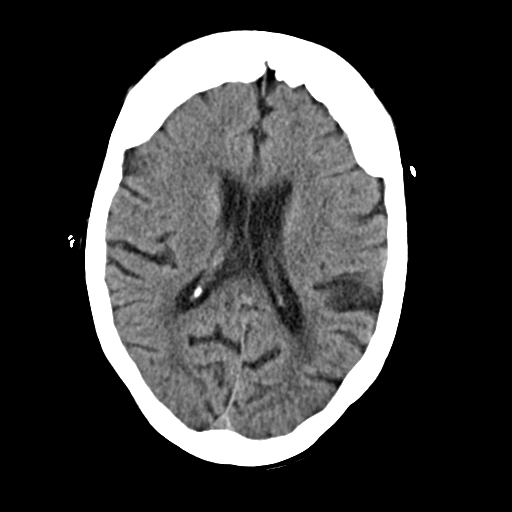
[im 16/31  brain]
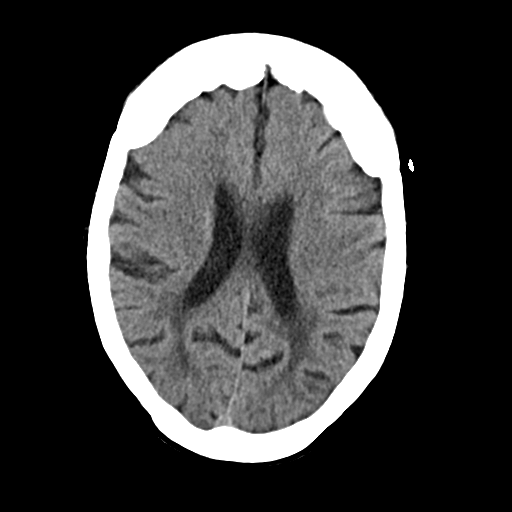
[im 16/31  bone]
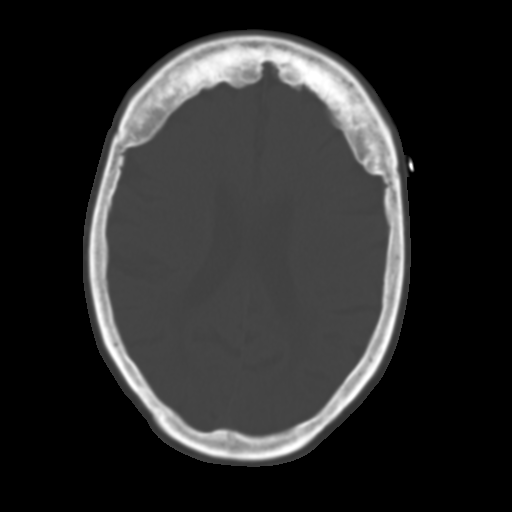
[im 18/31  brain]
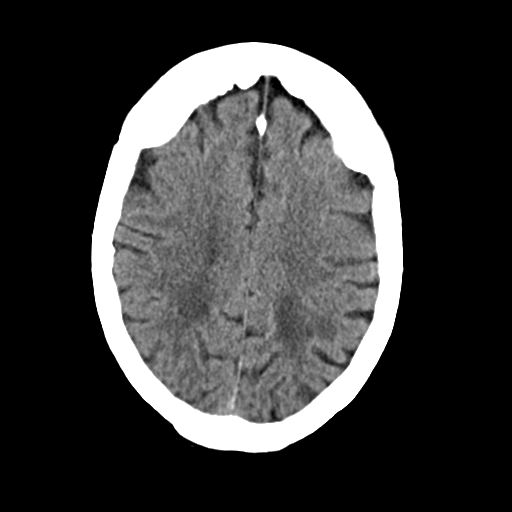
[im 20/31  brain]
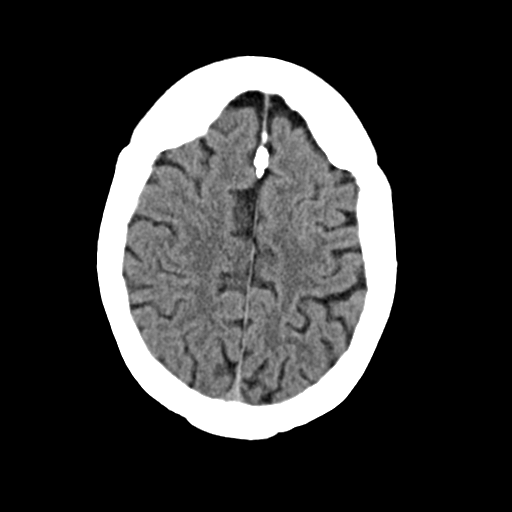
[im 22/31  brain]
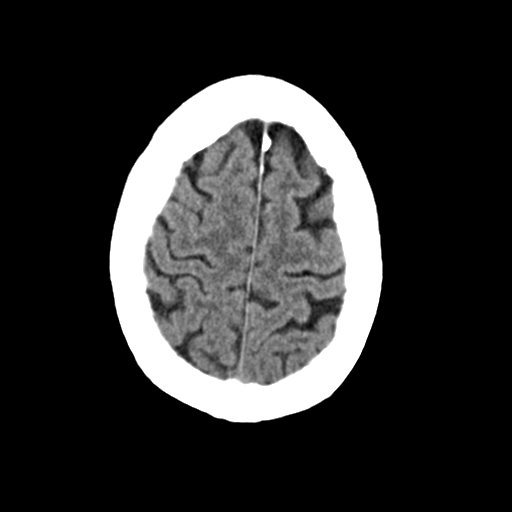
[im 23/31  brain]
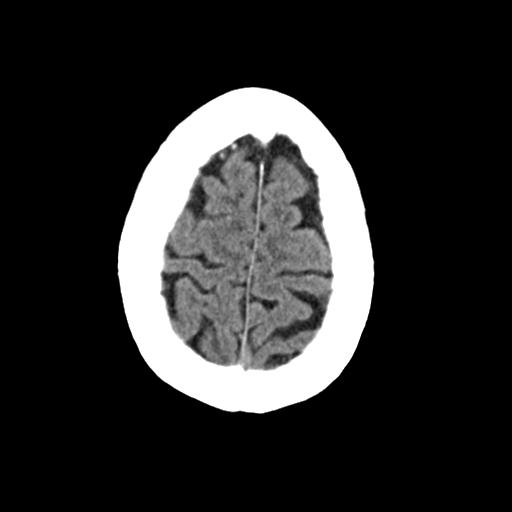
[im 23/31  bone]
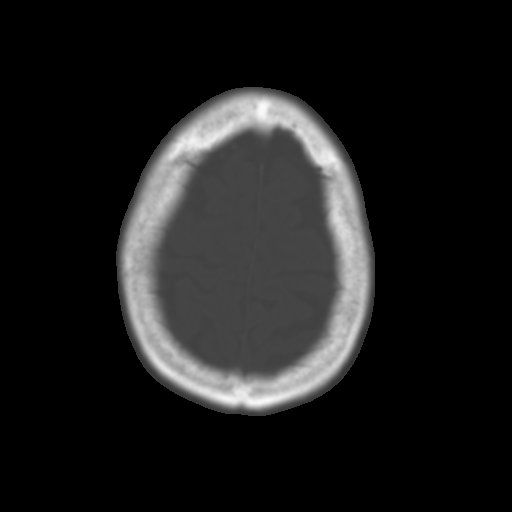
[im 25/31  brain]
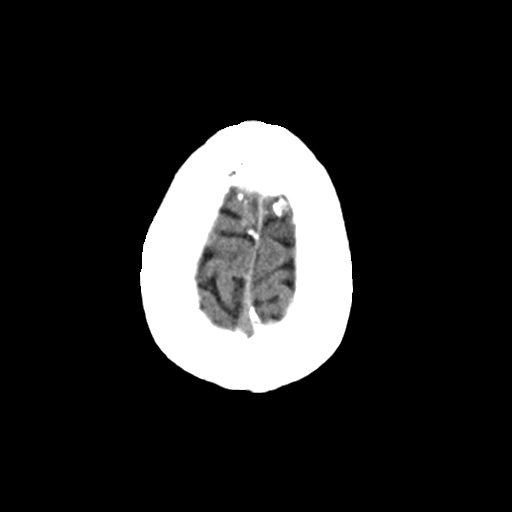
[im 27/31  brain]
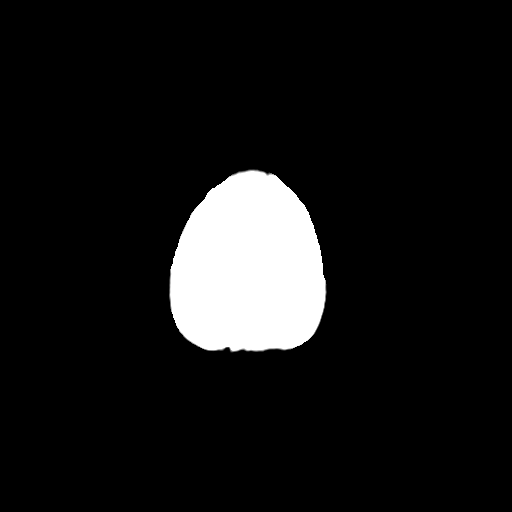
[im 29/31  brain]
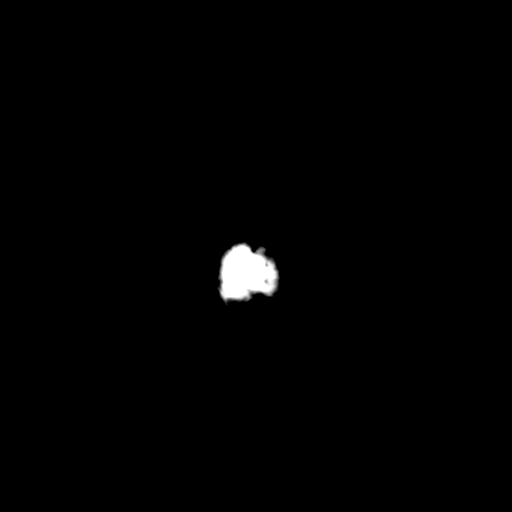

[16 of 30 positions shown; findings below may reference images not displayed]

FINDINGS: Brain: Mild cerebral atrophy. Mild hypodensity in the cerebral white
matter bilaterally. This appears chronic. Negative for acute
infarct, hemorrhage, or mass lesion. No fluid collection or midline
shift.

Vascular: Negative for hyperdense vessel

Skull: Negative

Sinuses/Orbits: Mucosal edema paranasal sinuses. Retention cyst in
the sphenoid sinus. Probable air-fluid level left maxillary sinus

Other: None
IMPRESSION: Atrophy and chronic microvascular ischemia. No acute intracranial
abnormality

Sinus mucosal disease with air-fluid level left maxillary sinus.

## 2020-10-06 ENCOUNTER — Emergency Department (HOSPITAL_BASED_OUTPATIENT_CLINIC_OR_DEPARTMENT_OTHER)
Admission: EM | Admit: 2020-10-06 | Discharge: 2020-10-07 | Disposition: A | Discharge status: Home / Self Care | Payer: Medicare PPO | Attending: Emergency Medicine | Admitting: Emergency Medicine

## 2020-10-06 ENCOUNTER — Emergency Department (HOSPITAL_BASED_OUTPATIENT_CLINIC_OR_DEPARTMENT_OTHER): Payer: Medicare PPO

## 2020-10-06 ENCOUNTER — Encounter (HOSPITAL_BASED_OUTPATIENT_CLINIC_OR_DEPARTMENT_OTHER): Payer: Self-pay | Admitting: Emergency Medicine

## 2020-10-06 ENCOUNTER — Other Ambulatory Visit: Payer: Self-pay

## 2020-10-06 DIAGNOSIS — Z9183 Wandering in diseases classified elsewhere: Secondary | ICD-10-CM | POA: Insufficient documentation

## 2020-10-06 DIAGNOSIS — F22 Delusional disorders: Secondary | ICD-10-CM | POA: Insufficient documentation

## 2020-10-06 DIAGNOSIS — R410 Disorientation, unspecified: Secondary | ICD-10-CM | POA: Diagnosis not present

## 2020-10-06 DIAGNOSIS — Z79899 Other long term (current) drug therapy: Secondary | ICD-10-CM | POA: Insufficient documentation

## 2020-10-06 DIAGNOSIS — F600 Paranoid personality disorder: Secondary | ICD-10-CM | POA: Insufficient documentation

## 2020-10-06 DIAGNOSIS — R4689 Other symptoms and signs involving appearance and behavior: Secondary | ICD-10-CM

## 2020-10-06 DIAGNOSIS — Z20822 Contact with and (suspected) exposure to covid-19: Secondary | ICD-10-CM | POA: Insufficient documentation

## 2020-10-06 DIAGNOSIS — I10 Essential (primary) hypertension: Secondary | ICD-10-CM | POA: Insufficient documentation

## 2020-10-06 DIAGNOSIS — R4182 Altered mental status, unspecified: Secondary | ICD-10-CM | POA: Diagnosis present

## 2020-10-06 LAB — AMMONIA: Ammonia: 19 umol/L (ref 9–35)

## 2020-10-06 LAB — URINALYSIS, ROUTINE W REFLEX MICROSCOPIC
Bilirubin Urine: NEGATIVE
Glucose, UA: NEGATIVE mg/dL
Hgb urine dipstick: NEGATIVE
Ketones, ur: NEGATIVE mg/dL
Leukocytes,Ua: NEGATIVE
Nitrite: NEGATIVE
Protein, ur: NEGATIVE mg/dL
Specific Gravity, Urine: <1.005 — ABNORMAL LOW (ref 1.005–1.030)
pH: 7 (ref 5.0–8.0)

## 2020-10-06 LAB — CBC WITH DIFFERENTIAL/PLATELET
Abs Immature Granulocytes: 0.01 10*3/uL (ref 0.00–0.07)
Basophils Absolute: 0 10*3/uL (ref 0.0–0.1)
Basophils Relative: 1 %
Eosinophils Absolute: 0.3 10*3/uL (ref 0.0–0.5)
Eosinophils Relative: 5 %
HCT: 34.8 % — ABNORMAL LOW (ref 36.0–46.0)
Hemoglobin: 10.9 g/dL — ABNORMAL LOW (ref 12.0–15.0)
Immature Granulocytes: 0 %
Lymphocytes Relative: 28 %
Lymphs Abs: 1.7 10*3/uL (ref 0.7–4.0)
MCH: 26.6 pg (ref 26.0–34.0)
MCHC: 31.3 g/dL (ref 30.0–36.0)
MCV: 84.9 fL (ref 80.0–100.0)
Monocytes Absolute: 0.5 10*3/uL (ref 0.1–1.0)
Monocytes Relative: 8 %
Neutro Abs: 3.6 10*3/uL (ref 1.7–7.7)
Neutrophils Relative %: 58 %
Platelets: 222 10*3/uL (ref 150–400)
RBC: 4.1 MIL/uL (ref 3.87–5.11)
RDW: 16.6 % — ABNORMAL HIGH (ref 11.5–15.5)
WBC: 6.1 10*3/uL (ref 4.0–10.5)
nRBC: 0 % (ref 0.0–0.2)

## 2020-10-06 LAB — COMPREHENSIVE METABOLIC PANEL
ALT: 10 U/L (ref 0–44)
AST: 19 U/L (ref 15–41)
Albumin: 4.5 g/dL (ref 3.5–5.0)
Alkaline Phosphatase: 51 U/L (ref 38–126)
Anion gap: 10 (ref 5–15)
BUN: 13 mg/dL (ref 8–23)
CO2: 29 mmol/L (ref 22–32)
Calcium: 10.3 mg/dL (ref 8.9–10.3)
Chloride: 103 mmol/L (ref 98–111)
Creatinine, Ser: 0.74 mg/dL (ref 0.44–1.00)
GFR, Estimated: >60 mL/min (ref >60)
Glucose, Bld: 88 mg/dL (ref 70–99)
Potassium: 3.3 mmol/L — ABNORMAL LOW (ref 3.5–5.1)
Sodium: 142 mmol/L (ref 135–145)
Total Bilirubin: 0.9 mg/dL (ref 0.3–1.2)
Total Protein: 7.7 g/dL (ref 6.5–8.1)

## 2020-10-06 LAB — TSH: TSH: 2.805 u[IU]/mL (ref 0.350–4.500)

## 2020-10-06 MED ORDER — QUETIAPINE FUMARATE 25 MG PO TABS
12.5000 mg | ORAL_TABLET | Freq: Every day | ORAL | Status: DC
  Administered 2020-10-06: 12.5 mg via ORAL

## 2020-10-06 NOTE — ED Notes (Signed)
TTS in progress 

## 2020-10-06 NOTE — BH Assessment (Signed)
TTS ready to see the Pt, if you can put them in private room to complete TTS assessment.  Clinician to call the cart.

## 2020-10-06 NOTE — ED Triage Notes (Signed)
Pt.'s Daughter feels that her mothers mental health is in decline. [facility] Police called Daughter approx. 2 weeks ago after the Pt was found wandering away from her residence. Th police were concerned for the safety and well being of the Pt.
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
## 2020-10-06 NOTE — ED Notes (Signed)
Pt alert to self and location pt is unsureof year or president

## 2020-10-06 NOTE — ED Provider Notes (Signed)
[facility] EMERGENCY DEPT Provider Note   CSN: [id number] Arrival date & time: 10/06/20  1529     History Chief Complaint  Patient presents with  . Psychiatric Evaluation    Kristina Randall is a 87 y.o. female.  The history is provided by the patient, a relative and medical records. No language interpreter was used.  Altered Mental Status Presenting symptoms: behavior changes, confusion and disorientation   Severity:  Moderate Most recent episode:  More than 2 days ago Episode history:  Continuous Duration:  2 weeks Timing:  Constant Progression:  Worsening Chronicity:  New Context: not head injury   Context comment:  Mild cognative impaoitment Associated symptoms: no abdominal pain, no agitation, no difficulty breathing, no fever, no headaches, no light-headedness, no nausea, no palpitations, no rash, no seizures, no slurred speech, no suicidal behavior, no vomiting and no weakness        Past Medical History:  Diagnosis Date  . Heart murmur   . Hypertension     Patient Active Problem List   Diagnosis Date Noted  . Mild cognitive impairment with memory loss 04/28/2016  . Rheumatic fever 01/29/2016  . Essential hypertension 01/01/2016    Past Surgical History:  Procedure Laterality Date  . ABDOMINAL HYSTERECTOMY  1973   with BSO-Fibroids  . lymphectomy Right 1973   Breast- Benign  . TONSILLECTOMY    . WISDOM TOOTH EXTRACTION       OB History   No obstetric history on file.     Family History  Problem Relation Age of Onset  . Heart attack Father   . Hearing loss Father   . Cancer Mother     Social History   Tobacco Use  . Smoking status: Never Smoker  . Smokeless tobacco: Never Used  Vaping Use  . Vaping Use: Never used  Substance Use Topics  . Alcohol use: Yes    Comment: occasional drink  . Drug use: No    Home Medications Prior to Admission medications   Medication Sig Start Date End Date Taking? Authorizing Provider   Ascorbic Acid (VITAMIN C) 1000 MG tablet Take 1,000 mg by mouth daily.    [provider]  Cholecalciferol (VITAMIN D3) 2000 units capsule Take 2,000 Units by mouth daily. For supplement    [provider]  valsartan-hydrochlorothiazide (DIOVAN-HCT) 320-12.5 MG tablet TAKE 1 TABLET EVERY DAY 08/30/18   Sharon Seller, NP  vitamin B-12 (CYANOCOBALAMIN) 1000 MCG tablet Take 1,000 mcg by mouth daily.    [provider]  vitamin E (VITAMIN E) 1000 UNIT capsule Take 1,000 Units by mouth daily.    [provider]    Allergies    Patient has no known allergies.  Review of Systems   Review of Systems  Constitutional: Negative for chills, diaphoresis, fatigue and fever.  HENT: Negative for congestion.   Eyes: Negative for visual disturbance.  Respiratory: Negative for chest tightness, shortness of breath and wheezing.   Cardiovascular: Negative for chest pain and palpitations.  Gastrointestinal: Negative for abdominal pain, constipation, diarrhea, nausea and vomiting.  Genitourinary: Negative for flank pain.  Musculoskeletal: Negative for back pain, neck pain and neck stiffness.  Skin: Negative for rash.  Neurological: Negative for dizziness, seizures, syncope, weakness, light-headedness, numbness and headaches.  Psychiatric/Behavioral: Positive for confusion. Negative for agitation and suicidal ideas. The patient is nervous/anxious.   All other systems reviewed and are negative.   Physical Exam Updated Vital Signs BP (!) 174/85 (BP Location: Right Arm)  Pulse 80   Temp 98.3 F (36.8 C) (Oral)   Resp 16   Ht 5\' 7"  (1.702 m)   Wt 59 kg   SpO2 94%   BMI 20.36 kg/m   Physical Exam Vitals and nursing note reviewed.  Constitutional:      General: She is not in acute distress.    Appearance: She is well-developed. She is not ill-appearing, toxic-appearing or diaphoretic.  HENT:     Head: Normocephalic and atraumatic.     Nose: No congestion or  rhinorrhea.     Mouth/Throat:     Mouth: Mucous membranes are moist.     Pharynx: No oropharyngeal exudate or posterior oropharyngeal erythema.  Eyes:     Extraocular Movements: Extraocular movements intact.     Conjunctiva/sclera: Conjunctivae normal.     Pupils: Pupils are equal, round, and reactive to light.  Cardiovascular:     Rate and Rhythm: Normal rate and regular rhythm.     Heart sounds: Murmur heard.    Pulmonary:     Effort: Pulmonary effort is normal. No respiratory distress.     Breath sounds: Normal breath sounds.  Abdominal:     Palpations: Abdomen is soft.     Tenderness: There is no abdominal tenderness. There is no right CVA tenderness, left CVA tenderness, guarding or rebound.  Musculoskeletal:        General: No tenderness.     Cervical back: Neck supple. No tenderness.  Skin:    General: Skin is warm and dry.     Findings: No erythema.  Neurological:     General: No focal deficit present.     Mental Status: She is alert.     Sensory: No sensory deficit.     Motor: No weakness.  Psychiatric:        Mood and Affect: Mood is anxious.        Behavior: Behavior is not agitated.        Thought Content: Thought content is paranoid and delusional.     Comments: Paranoid that people are coming to get her and delusions at there is some interviewer trying to get her.     ED Results / Procedures / Treatments   Labs (all labs ordered are listed, but only abnormal results are displayed) Labs Reviewed  URINALYSIS, ROUTINE W REFLEX MICROSCOPIC - Abnormal; Notable for the following components:      Result Value   Color, Urine COLORLESS (*)    Specific Gravity, Urine <1.005 (*)    All other components within normal limits  CBC WITH DIFFERENTIAL/PLATELET - Abnormal; Notable for the following components:   Hemoglobin 10.9 (*)    HCT 34.8 (*)    RDW 16.6 (*)    All other components within normal limits  COMPREHENSIVE METABOLIC PANEL - Abnormal; Notable for the  following components:   Potassium 3.3 (*)    All other components within normal limits  URINE CULTURE  AMMONIA  TSH    EKG None  Radiology DG Chest Portable 1 View  Result Date: 10/06/2020 CLINICAL DATA:  Confusion.  Rule out occult infection. EXAM: PORTABLE CHEST 1 VIEW COMPARISON:  None. FINDINGS: The heart size and mediastinal contours are within normal limits. Both lungs are clear. The visualized skeletal structures are unremarkable. IMPRESSION: No active disease. Electronically Signed   By: 10/08/2020 III M.D   On: 10/06/2020 16:52    Procedures Procedures   Medications Ordered in ED Medications  QUEtiapine (SEROQUEL) tablet 12.5 mg (12.5  mg Oral Given 10/06/20 2333)    ED Course  I have reviewed the triage vital signs and the nursing notes.  Pertinent labs & imaging results that were available during my care of the patient were reviewed by me and considered in my medical decision making (see chart for details).    MDM Rules/Calculators/A&P                          Kristina Randall is a 87 y.o. female with a past medical history significant for hypertension, prior rheumatic fever with subsequent heart murmur, and mild cognitive impairment with memory loss who presents with confusion, unsafe behaviors, and paranoia.  According to family, last few weeks patient has had a worsening decline in her mental state.  She is wandering off from her independent living complex and had to be brought back by Patent examiner.  She is also now complaining of feeling unsafe that there are people after her.  Daughter reports that she is having these paranoid thoughts that someone is trying to get her.  Patient is tearful in the room when describing them.  Patient reports that physically she is feeling well and denies any fevers, chills, headache, trauma, neck pain, chest pain, shortness of breath, cough, congestion, urinary symptoms or GI symptoms.  Family reports that she has had some mental  decline over the last few months that over the last few weeks it has rapidly worsened.  When patient was going to go on some errands with family, she came down disheveled without any items in her pocketbook and was acting confused.  This is all worsening and the facility was feeling concerned about the patient's safety.  On exam, lungs are clear and chest is nontender.  Abdomen is nontender.  Patient is resting comfortably but is tearful when talking about people coming after her.  She would not answer if she is having any auditory or visual hallucinations.  She had no focal neurologic deficits on my initial exam.  Resting comfortably.  Clinically I do feel need to rule out some organic cause such as urinary tract infection, had pneumonia, or electrolyte imbalance causing this worsening mental status however given her history of some cognitive impairment, I do worry that this is getting to a state where she is now putting herself in more danger situations by wandering off.  Anticipate medical clearance with labs, chest x-ray, urine and if that is all reassuring, anticipate TTS consultation to discuss further management.    7:00 PM Work-up to look for medical cause of symptoms appears to be reassuring.  Urinalysis does not show infection.  No leukocytosis.  Mild anemia but not critically low.  Metabolic panel shows slight hypokalemia but otherwise normal kidney function.  Normal liver function.  Ammonia not elevated.  TSH still in process but chest x-ray shows no pneumonia.  Patient is felt to be medically clear when TSH has returned.  Will consult TTS for discussions of management given her paranoia, delusion, and the unsafe behaviors of wandering outside of her complex..   TTS saw the patient and agrees he does not safe for going back to independent living facility this time.  They wanted to start Seroquel to help with the symptoms of paranoia and delusion.  They recommend consultation to transition  of care/social work/case management to discuss likely placement for the patient.  Care transferred to oncoming team while waiting for social work and [facility] evaluation to discuss placement.  Final Clinical Impression(s) / ED Diagnoses Final diagnoses:  Delusion (HCC)  Wandering behavior     Clinical Impression: 1. Delusion (HCC)   2. Wandering behavior     Disposition: Care transferred to oncoming team while [facility].  This note was prepared with assistance of Conservation officer, historic buildingsDragon voice recognition software. Occasional wrong-word or sound-a-like substitutions may have occurred due to the inherent limitations of voice recognition software.      Travarius Lange, Canary Brimhristopher J, MD 10/06/20 [phone]

## 2020-10-06 NOTE — ED Notes (Signed)
While attempting to collect UA sample on patient, pt became confused with explanation of steps to collect urine. Nurse had to repeat several times, and then had to show patient. Pt unable to void at this time.

## 2020-10-07 DIAGNOSIS — F22 Delusional disorders: Secondary | ICD-10-CM | POA: Diagnosis not present

## 2020-10-07 DIAGNOSIS — R410 Disorientation, unspecified: Secondary | ICD-10-CM | POA: Diagnosis not present

## 2020-10-07 DIAGNOSIS — F600 Paranoid personality disorder: Secondary | ICD-10-CM | POA: Diagnosis not present

## 2020-10-07 DIAGNOSIS — R4182 Altered mental status, unspecified: Secondary | ICD-10-CM | POA: Diagnosis present

## 2020-10-07 DIAGNOSIS — I10 Essential (primary) hypertension: Secondary | ICD-10-CM | POA: Diagnosis not present

## 2020-10-07 DIAGNOSIS — Z9183 Wandering in diseases classified elsewhere: Secondary | ICD-10-CM | POA: Diagnosis not present

## 2020-10-07 DIAGNOSIS — Z79899 Other long term (current) drug therapy: Secondary | ICD-10-CM | POA: Diagnosis not present

## 2020-10-07 LAB — RESPIRATORY PANEL BY PCR

## 2020-10-07 MED ORDER — IRBESARTAN 300 MG PO TABS
300.0000 mg | ORAL_TABLET | Freq: Every day | ORAL | Status: DC
  Administered 2020-10-07: 300 mg via ORAL
  Filled 2020-10-07: qty 2

## 2020-10-07 NOTE — ED Notes (Signed)
TTS called and reported that Social Work will be calling in the morning to provide consult for patient.

## 2020-10-07 NOTE — ED Notes (Signed)
Report given to Charge Nurse Ed.

## 2020-10-07 NOTE — Progress Notes (Signed)
Care Management Discussed with MD, CSW. Patient brought in for wanderinng, is not combative, has to be constantly redirected. Patient is ambulatory. Has daughter as primary contact. CSW will call daughter to discuss options.

## 2020-10-07 NOTE — Progress Notes (Signed)
CSW received return call from daughter. Per daughter her and her brother work seven days a week and patient has no other family. Family expressed concern that they do not feel they could provide the support she would need. CSW inquired into Medicaid and noted patient does not have Medicaid and while they're not sure of the exact amount patient gets each month it would be close to the limit. CSW discussed SNF process and need for a PT eval which per MD would require patient be transferred to an ED with PT available and noted daughter reported she would be open to the short-term SNF options. CSW updated RNCM and will coordinate to facilitate patient care.

## 2020-10-07 NOTE — Consult Note (Signed)
  Kristina Randall 87 year old female who presented to [facility] ED for confusion, unsafe behaviors, and paranoia. Per chart review patient wandered off from independent living requiring law enforcement to be called; family reports gradual mental decline over past few months with noticeable increase over past few weeks. Patient was psychiatrically cleared on 10/06/20; recommended for SW/TOC consult. Per chart review, 10/07/20 not patient transferred to [facility] for SNF work-up/PT evaluation; no further psychiatric concerns noted. TTS consult discontinued.

## 2020-10-07 NOTE — Progress Notes (Signed)
TOC CSW attempted to contact pts daughter/Aurelia Corley [phone].  CSW left HIPPA compliant message with my contact information.  CSW will continue to attempt to contact pts daughter/Aurelia.  Nolan Lasser Tarpley-Carter, MSW, LCSW-A Pronouns:  She, Her, Hers                  [place] ED Transitions of CareClinical Social Worker [email or web address] [phone]   

## 2020-10-07 NOTE — Discharge Instructions (Signed)
Your work-up over the last 24 hours was overall reassuring in regards to the lab work and imaging we did.  We did not find evidence of acute infection or significant electrolyte abnormalities.  We had to transfer over here for evaluation with the social work team and case management and psychiatry team and they all feel you are now safe for discharge home after discussions.  Please have her follow-up with your primary doctor and to follow-up with the case management team in regards to pursuing placement if needed.  If any symptoms change or worsen acutely, please return to the nearest emergency department.

## 2020-10-07 NOTE — ED Notes (Signed)
Left voicemail with weekend RN CM Judeth Cornfield [phone] at 634am to call in regards to consult.

## 2020-10-07 NOTE — Progress Notes (Addendum)
TOC CSW attempted to contact pts daughter/Aurelia Corley [phone].  CSW left HIPPA compliant message with my contact information.  CSW will continue to attempt to contact pts daughter/Aurelia.  Alecia Doi Tarpley-Carter, MSW, LCSW-A Pronouns:  She, Her, Hers                  [facility] Transitions of CareClinical Social Worker [email or web address] [phone]

## 2020-10-07 NOTE — Progress Notes (Signed)
CSW coordinating with RNCM for [facility] consult for SNF placement upon recommendation of TTS. Per chart review patient only has [facility] listed. Unclear if no Medicaid coverage or not which would be a barrier for long-term SNF or assisted living options unless able to self-pay. CSW noted per [facility] who spoke with [facility] provider they do not have PT at location to do a PT eval which would be required for patient's insurance for convalescent SNF stay. CSW notes they would transfer patient to [facility] for PT eval if needed.   CSW attempted to reach daughter to begin discussions of different options to see how the family wanted to proceed. CSW notes phone call went straight to VM and CSW left a HIPPA compliant VM.

## 2020-10-07 NOTE — ED Notes (Signed)
Pt given soup and crackers to eat. Pt has coffee and hot chocolate earlier. Pt ambulated unit with staff. Pt medicated for her elevated blood pressure.

## 2020-10-07 NOTE — Progress Notes (Signed)
TOC CSW informed Aurelia/pts daughter of her options for pt.  Options for pt is CSW will do a referral for [facility] and CSW will aslo ask TOC CM/Alesia to assist with Home Health.  Aurelia was informed of PCP's role in pts placement.  Aurelia was informed that Medicaid may be a financial option for pt to assist with placement, Aurelia should assist pt with applying.  Aurelia will be picking pt up by 6pm tonight.  Lamis Behrmann Tarpley-Carter, MSW, LCSW-A Pronouns:  She, Her, Hers                  [facility] Transitions of CareClinical Social Worker [email or web address] [phone]

## 2020-10-07 NOTE — ED Notes (Signed)
Patients daughter Royce Macadamia needs to leave to run errands but is available to come back at any time if needed. Phone number on chart is correct for when case management responds to consult.

## 2020-10-07 NOTE — Progress Notes (Signed)
TOC CSW attempted to contact pts daughter/Aurelia Corley [phone].  CSW could not leave a HIPPA compliant message with my contact information, due to messages being full.  CSW will continue to attempt to contact pts daughter/Aurelia.    Bertice Risse Tarpley-Carter, MSW, LCSW-A Pronouns:  She, Her, Hers                  [facility] Transitions of CareClinical Social Worker [email or web address] [phone]

## 2020-10-07 NOTE — ED Notes (Signed)
Called and left message for pts daughter informing her of pts transfer.

## 2020-10-07 NOTE — ED Notes (Signed)
Voicemail left for the daughter to try and obtain home medication list.

## 2020-10-07 NOTE — ED Notes (Signed)
Assisted patient with bath,brush teeth,and ambulated unit. Pt currently sitting in room in chair. Pt still confused this am,needs step by step instruction for everything.

## 2020-10-07 NOTE — Progress Notes (Signed)
..   Transition of Care [facility]) - Emergency Department Mini Assessment   Patient Details  Name: Kristina Randall MRN: [id number] Date of Birth: 04-12-1934  [facility]) CM/SW Contact:    Kristina Randall, LCSWA Phone Number: 10/07/2020, 2:40 PM   Clinical Narrative: TOC CSW has attempted to contact pts daughter/Kristina Randall [phone] without success.  CSW is recommending that family take pt back to Independent Living facility to obtain assistance with placing pt in an appropriate facility.  Family would also need to reach out to primary care physician to assist with this process, as well as, looking into if pt would qualify for Medicaid.  Medicaid will assist with financial component of placement.  Pt is not appropriate for SNF, due to wandering.  Also pt does not have a skillable need for SNF.  Kristina Randall, MSW, LCSW-A Pronouns:  She, Her, Hers                  [facility] ED Transitions of CareClinical Social Worker [email or web address] [phone]   ED Mini Assessment: What brought you to the Emergency Department? : Psych Evaluation  Barriers to Discharge: No Barriers Identified     Means of departure: Not know  Interventions which prevented an admission or readmission: SNF Placement    Patient Contact and Communications Key Contact 1: Kristina Randall Date: 10/07/20,     Contact Phone Number: [phone] Call outcome: No answer, CSW left a HIPPA compliant message for pts daughter/Kristina Randall.      Choice offered to / list presented to : NA  Admission diagnosis:  Psych Eval  Patient Active Problem List   Diagnosis Date Noted  . Mild cognitive impairment with memory loss 04/28/2016  . Rheumatic fever 01/29/2016  . Essential hypertension 01/01/2016   PCP:  Pcp, No Pharmacy:   [id number] - [place], [place] - [address] AT [facility] OF [address] [place] [address] Phone: [phone] Fax: [phone]  [facility] - [address] [place] - [address] [place] [address] Phone: [phone] Fax: [phone]

## 2020-10-07 NOTE — ED Notes (Deleted)
PTs Daughter

## 2020-10-07 NOTE — ED Provider Notes (Signed)
5:27 PM I was just informed by nursing and social work that patient is now safe for discharge home.  I saw the patient yesterday at another facility and patient was transferred here for further evaluation.  Plan of care was initially to seek placement at a skilled nursing facility however after discussion with social work with the patient, they did not feel she was appropriate at this time.  They will help her pursue placement to a skilled nursing facility if symptoms progress or persistent.  Family agrees with plan of care and discharge home.  Patient we discharged.  They understand return precautions and plan of care.   Clinical Impression: 1. Delusion (HCC)   2. Wandering behavior     Disposition: Discharge  Condition: Good  I have discussed the results, Dx and Tx plan with the pt(& family if present). He/she/they expressed understanding and agree(s) with the plan. Discharge instructions discussed at great length. Strict return precautions discussed and pt &/or family have verbalized understanding of the instructions. No further questions at time of discharge.    New Prescriptions   No medications on file    Follow Up: [facility] [address] [id number] mc [place] [address] [phone]          Annessa Satre, Canary Brim, MD 10/07/20 [phone]

## 2020-10-07 NOTE — ED Notes (Signed)
pts daughter returned phone call to nurse and cannot verify pts meds. She doesn't know what pharmacy her mother uses and thinks she probably hasnt taken her meds in a while

## 2020-10-07 NOTE — TOC Progression Note (Signed)
Transition of Care [facility]) - Progression Note    Patient Details  Name: Kristina Randall MRN: [id number] Date of Birth: [birth year]  Transition of Care [facility]) CM/SW Contact  Lockie Pares, RN Phone Number: 10/07/2020, 12:01 PM  Clinical Narrative:    Patient will transfer to [facility] for SNF work up/ PT evaluation. This RNCM informed the CSW at [facility] of the transfer   Expected Discharge Plan: Skilled Nursing Facility Barriers to Discharge: Continued Medical Work up  Expected Discharge Plan and Services Expected Discharge Plan: Skilled Nursing Facility In-house Referral: Clinical Social Work Discharge Planning Services: CM Consult   Living arrangements for the past 2 months: Independent Living Facility                                       Social Determinants of Health (SDOH) Interventions    Readmission Risk Interventions No flowsheet data found.

## 2020-10-07 NOTE — ED Provider Notes (Signed)
Patient labs are reassuring.  She continues to be hypertensive and in the past it looks like she was on valsartan and this was restarted.  Daughter spoke with social work and case management and she wants to proceed with possible SNF placement.  Patient will need a PT evaluation for this which we cannot obtain at our facility.  Daughter requests that patient be sent to [facility].  Patient excepted by Dr. Myrna Blazer, MD 10/07/20 [phone]

## 2020-10-07 NOTE — ED Notes (Signed)
Gave Pt some Vegetable Soup, Crackers and Water for [employer]

## 2020-10-08 LAB — URINE CULTURE: Culture: NO GROWTH

## 2020-10-08 NOTE — Progress Notes (Signed)
10/08/2020  @ 7:35pm  TOC CSW sent pts referral to [name] ([email or web address])  In a secure email.  [name], MSW, LCSW-A Pronouns:  She, Her, Hers                  [facility] Transitions of CareClinical Social Worker [email or web address] [phone]

## 2021-04-03 ENCOUNTER — Ambulatory Visit: Payer: Medicare PPO | Admitting: Neurology

## 2021-04-08 ENCOUNTER — Ambulatory Visit: Payer: Medicare PPO | Admitting: Neurology

## 2021-04-08 ENCOUNTER — Encounter: Payer: Self-pay | Admitting: Neurology

## 2021-04-08 VITALS — Ht 67.0 in | Wt 142.0 lb

## 2021-04-08 DIAGNOSIS — R799 Abnormal finding of blood chemistry, unspecified: Secondary | ICD-10-CM

## 2021-04-08 DIAGNOSIS — G309 Alzheimer's disease, unspecified: Secondary | ICD-10-CM

## 2021-04-08 DIAGNOSIS — F02B0 Dementia in other diseases classified elsewhere, moderate, without behavioral disturbance, psychotic disturbance, mood disturbance, and anxiety: Secondary | ICD-10-CM | POA: Diagnosis not present

## 2021-04-08 MED ORDER — DONEPEZIL HCL 10 MG PO TABS: 10.0000 mg | ORAL_TABLET | Freq: Every day | ORAL | 4 refills | Status: DC

## 2021-04-08 NOTE — Progress Notes (Signed)
[facility]  PATIENT: Kristina Randall DOB: December 24, 1933  REFERRING CLINICIAN: Ellyn Hack, MD HISTORY FROM: Daughter  REASON FOR VISIT:  Memory decline    HISTORICAL  CHIEF COMPLAINT:  Chief Complaint  Patient presents with   New Patient (Initial Visit)    Rm 13, with daughter, states memory is worse , mmse 17    HISTORY OF PRESENT ILLNESS:  This is a 87 year old woman with past medical history of hypertension tension who is presenting for worsening memory.  Patient is accompanied by her daughter Royce Macadamia who provide most of the history.  She reported her memory is worse, she is very forgetful, sometimes asking the same question over and over and does not remember new conversations.  She use to be independent, lives alone but in March the police called daughter because patient was found wandering on the street, when asked what she was doing she reported that she was looking for an apartment.  Since then, she has been living with her daughter.  Her daughter helps with her meal, and sometimes she helps with dressing her.  She reported good and bad days and on bad days she cannot even dress herself.  When she was living alone she had difficulty paying her bills on time, her stove was turned off by her apartment building because there was some concern of burning her pots.  She was seen in 2017 for memory impairment, at that time there was discussion of starting Namenda or Aricept but the medication was not started.  At that time her Mini-Mental status score was a 24 out of 30 and on today's evaluation is 17 out of 30.     OTHER MEDICAL CONDITIONS: Hypertension   REVIEW OF SYSTEMS: Full 14 system review of systems performed and negative with exception of: HTN, memory decline   ALLERGIES: No Known Allergies  HOME MEDICATIONS: Outpatient Medications Prior to Visit  Medication Sig Dispense Refill   Ascorbic Acid (VITAMIN C) 1000 MG tablet Take 1,000 mg by mouth daily.      Cholecalciferol (VITAMIN D3) 2000 units capsule Take 2,000 Units by mouth daily. For supplement     valsartan-hydrochlorothiazide (DIOVAN-HCT) 320-12.5 MG tablet TAKE 1 TABLET EVERY DAY 90 tablet 0   vitamin B-12 (CYANOCOBALAMIN) 1000 MCG tablet Take 1,000 mcg by mouth daily.     vitamin E (VITAMIN E) 1000 UNIT capsule Take 1,000 Units by mouth daily.     No facility-administered medications prior to visit.    PAST MEDICAL HISTORY: Past Medical History:  Diagnosis Date   Heart murmur    Hypertension     PAST SURGICAL HISTORY: Past Surgical History:  Procedure Laterality Date   ABDOMINAL HYSTERECTOMY  1973   with BSO-Fibroids   lymphectomy Right 1973   Breast- Benign   TONSILLECTOMY     WISDOM TOOTH EXTRACTION      FAMILY HISTORY: Family History  Problem Relation Age of Onset   Heart attack Father    Hearing loss Father    Cancer Mother     SOCIAL HISTORY: Social History   Socioeconomic History   Marital status: Legally Separated    Spouse name: Not on file   Number of children: Not on file   Years of education: Not on file   Highest education level: Not on file  Occupational History   Not on file  Tobacco Use   Smoking status: Never   Smokeless tobacco: Never  Vaping Use   Vaping Use: Never used  Substance and Sexual Activity   Alcohol use: Yes    Comment: occasional drink   Drug use: No   Sexual activity: Not Currently  Other Topics Concern   Not on file  Social History Narrative   Diet?      Do you drink/eat things with caffeine? Yes      Marital status?           divorced                         What year were you married? 1954      Do you live in a house, apartment, assisted living, condo, trailer, etc.? apartment      Is it one or more stories? yes      How many persons live in your home? 1      Do you have any pets in your home? (please list) no      Current or past profession: Retail buyer and sales      Do you exercise?         yes                              Type & how often? Walking and gym      Do you have a living will? no      Do you have a DNR form?                    ?              If not, do you want to discuss one? yes      Do you have signed POA/HPOA for forms?  ?      Social Determinants of Health   Financial Resource Strain: Not on file  Food Insecurity: Not on file  Transportation Needs: Not on file  Physical Activity: Not on file  Stress: Not on file  Social Connections: Not on file  Intimate Partner Violence: Not on file    PHYSICAL EXAM  GENERAL EXAM/CONSTITUTIONAL: Vitals:  Vitals:   04/08/21 0858  Weight: 142 lb (64.4 kg)  Height: 5\' 7"  (1.702 m)   Body mass index is 22.24 kg/m. Wt Readings from Last 3 Encounters:  04/08/21 142 lb (64.4 kg)  10/06/20 130 lb (59 kg)  04/12/18 183 lb 9.6 oz (83.3 kg)   Patient is in no distress; well developed, nourished and groomed; neck is supple  EYES: Pupils round and reactive to light, Visual fields full to confrontation, Extraocular movements intacts,   MUSCULOSKELETAL: Gait, strength, tone, movements noted in Neurologic exam below  NEUROLOGIC: MENTAL STATUS:  MMSE - Mini Mental State Exam 04/08/2021 05/20/2017 04/28/2016  Orientation to time 0 2 5  Orientation to Place 4 4 3   Registration 3 3 3   Attention/ Calculation 2 5 5   Recall 0 0 0  Language- name 2 objects 2 2 2   Language- repeat 1 1 1   Language- follow 3 step command 3 3 3   Language- read & follow direction 1 1 1   Write a sentence 1 1 1   Copy design 0 1 0  Total score 17 23 24    Able to name repeat, and compliant.  Able to count the number of quarters in $1.75 but has difficulty stating the days of the week backwards  CRANIAL NERVE:  2nd, 3rd, 4th, 6th - pupils equal and reactive to  light, visual fields full to confrontation, extraocular muscles intact, no nystagmus 5th - facial sensation symmetric 7th - facial strength symmetric 8th - hearing intact 9th -  palate elevates symmetrically, uvula midline 11th - shoulder shrug symmetric 12th - tongue protrusion midline  MOTOR:  normal bulk and tone, full strength in the BUE, BLE  SENSORY:  normal and symmetric to light touch, pinprick, temperature, vibration  COORDINATION:  finger-nose-finger, fine finger movements normal  GAIT/STATION:  normal   DIAGNOSTIC DATA (LABS, IMAGING, TESTING) - I reviewed patient records, labs, notes, testing and imaging myself where available.  Lab Results  Component Value Date   WBC 6.1 10/06/2020   HGB 10.9 (L) 10/06/2020   HCT 34.8 (L) 10/06/2020   MCV 84.9 10/06/2020   PLT 222 10/06/2020      Component Value Date/Time   NA 142 10/06/2020 1630   K 3.3 (L) 10/06/2020 1630   CL 103 10/06/2020 1630   CO2 29 10/06/2020 1630   GLUCOSE 88 10/06/2020 1630   BUN 13 10/06/2020 1630   CREATININE 0.74 10/06/2020 1630   CREATININE 0.82 04/12/2018 1057   CALCIUM 10.3 10/06/2020 1630   PROT 7.7 10/06/2020 1630   ALBUMIN 4.5 10/06/2020 1630   AST 19 10/06/2020 1630   ALT 10 10/06/2020 1630   ALKPHOS 51 10/06/2020 1630   BILITOT 0.9 10/06/2020 1630   GFRNONAA >60 10/06/2020 1630   GFRNONAA 67 04/12/2018 1057   GFRAA 77 04/12/2018 1057   Lab Results  Component Value Date   CHOL 165 05/12/2017   HDL 52 05/12/2017   LDLCALC 94 05/12/2017   TRIG 96 05/12/2017   CHOLHDL 3.2 05/12/2017   No results found for: HGBA1C No results found for: VITAMINB12 Lab Results  Component Value Date   TSH 2.805 10/06/2020    Head CT 2019 Atrophy and chronic microvascular ischemia. No acute intracranial abnormality   ASSESSMENT AND PLAN  87 y.o. year old female with past medical history of hypertension who is presenting with memory decline, likely Alzheimer's type dementia.  I will start her on Aricept half tab daily for 1 week then increase it to full tab.  Dementia progression discussed with daughter, encourage, good diet, exercise daily for at least 20-minute  and good sleep.  Her last TSH was done in April of this year this year and was normal at 2.805.  I will check a B12 and vitamin D level and call daughter with results.  Follow-up in 1 year   1. Moderate Alzheimer's dementia without behavioral disturbance, psychotic disturbance, mood disturbance, or anxiety, unspecified timing of dementia onset (HCC)      PLAN: Start with Aricept, 1/2 tab for one week then full tab thereafter  Return in 1 year     There are well-accepted and sensible ways to reduce risk for Alzheimers disease and other degenerative brain disorders .  Exercise Daily Walk A daily 20 minute walk should be part of your routine. Disease related apathy can be a significant roadblock to exercise and the only way to overcome this is to make it a daily routine and perhaps have a reward at the end (something your loved one loves to eat or drink perhaps) or a personal trainer coming to the home can also be very useful. Most importantly, the patient is much more likely to exercise if the caregiver / spouse does it with him/her. In general a structured, repetitive schedule is best.  General Health: Any diseases which effect your body will effect your  brain such as a pneumonia, urinary infection, blood clot, heart attack or stroke. Keep contact with your primary care doctor for regular follow ups.  Sleep. A good nights sleep is healthy for the brain. Seven hours is recommended. If you have insomnia or poor sleep habits we can give you some instructions. If you have sleep apnea wear your mask.  Diet: Eating a heart healthy diet is also a good idea; fish and poultry instead of red meat, nuts (mostly non-peanuts), vegetables, fruits, olive oil or canola oil (instead of butter), minimal salt (use other spices to flavor foods), whole grain rice, bread, cereal and pasta and wine in moderation.Research is now showing that the MIND diet, which is a combination of The Mediterranean diet and the DASH  diet, is beneficial for cognitive processing and longevity. Information about this diet can be found in The MIND Diet, a book by Alonna Minium, MS, RDN, and online at [employer]  Finances, Power of [address] and Advance Directives: You should consider putting legal safeguards in place with regard to financial and medical decision making. While the spouse always has power of attorney for medical and financial issues in the absence of any form, you should consider what you want in case the spouse / caregiver is no longer around or capable of making decisions.     Heart-head connection  New research shows there are things we can do to reduce the risk of mild cognitive impairment and dementia.  Several conditions known to increase the risk of cardiovascular disease -- such as high blood pressure, diabetes and high cholesterol -- also increase the risk of developing Alzheimer's. Some autopsy studies show that as many as 80 percent of individuals with Alzheimer's disease also have cardiovascular disease.  A longstanding question is why some people develop hallmark Alzheimer's plaques and tangles but do not develop the symptoms of Alzheimer's. Vascular disease may help researchers eventually find an answer. Some autopsy studies suggest that plaques and tangles may be present in the brain without causing symptoms of cognitive decline unless the brain also shows evidence of vascular disease. More research is needed to better understand the link between vascular health and Alzheimer's.  Physical exercise and diet Regular physical exercise may be a beneficial strategy to lower the risk of Alzheimer's and vascular dementia. Exercise may directly benefit brain cells by increasing blood and oxygen flow in the brain. Because of its known cardiovascular benefits, a medically approved exercise program is a valuable part of any overall wellness plan.  Current evidence suggests that  heart-healthy eating may also help protect the brain. Heart-healthy eating includes limiting the intake of sugar and saturated fats and making sure to eat plenty of fruits, vegetables, and whole grains. No one diet is best. Two diets that have been studied and may be beneficial are the DASH (Dietary Approaches to Stop Hypertension) diet and the Mediterranean diet. The DASH diet emphasizes vegetables, fruits and fat-free or low-fat dairy products; includes whole grains, fish, poultry, beans, seeds, nuts and vegetable oils; and limits sodium, sweets, sugary beverages and red meats. A Mediterranean diet includes relatively little red meat and emphasizes whole grains, fruits and vegetables, fish and shellfish, and nuts, olive oil and other healthy fats.  Social connections and intellectual activity A number of studies indicate that maintaining strong social connections and keeping mentally active as we age might lower the risk of cognitive decline and Alzheimer's. Experts are not certain about the reason for this association. It may be due to  direct mechanisms through which social and mental stimulation strengthen connections between nerve cells in the brain.  Head trauma There appears to be a strong link between future risk of Alzheimer's and serious head trauma, especially when injury involves loss of consciousness. You can help reduce your risk of Alzheimer's by protecting your head.  Wear a seat belt  Use a helmet when participating in sports  "Fall-proof" your home   What you can do now While research is not yet conclusive, certain lifestyle choices, such as physical activity and diet, may help support brain health and prevent Alzheimer's. Many of these lifestyle changes have been shown to lower the risk of other diseases, like heart disease and diabetes, which have been linked to Alzheimer's. With few drawbacks and plenty of known benefits, healthy lifestyle choices can improve your health and possibly  protect your brain.  Learn more about brain health. You can help increase our knowledge by considering participation in a clinical study. Our free clinical trial matching services, TrialMatch, can help you find clinical trials in your area that are seeking volunteers.      Orders Placed This Encounter  Procedures   Vitamin B12   Vitamin D, 25-hydroxy     Meds ordered this encounter  Medications   donepezil (ARICEPT) 10 MG tablet    Sig: Take 1 tablet (10 mg total) by mouth at bedtime.    Dispense:  90 tablet    Refill:  4     Return in about 1 year (around 04/08/2022).    Windell Norfolk, MD 04/08/2021, 10:36 AM  [facility] [address], [address] [place], [place] [address] [phone]

## 2021-04-08 NOTE — Patient Instructions (Signed)
Start with Aricept, 1/2 tab for one week then full tab thereafter  Return in 1 year       There are well-accepted and sensible ways to reduce risk for Alzheimers disease and other degenerative brain disorders .  Exercise Daily Walk A daily 20 minute walk should be part of your routine. Disease related apathy can be a significant roadblock to exercise and the only way to overcome this is to make it a daily routine and perhaps have a reward at the end (something your loved one loves to eat or drink perhaps) or a personal trainer coming to the home can also be very useful. Most importantly, the patient is much more likely to exercise if the caregiver / spouse does it with him/her. In general a structured, repetitive schedule is best.  General Health: Any diseases which effect your body will effect your brain such as a pneumonia, urinary infection, blood clot, heart attack or stroke. Keep contact with your primary care doctor for regular follow ups.  Sleep. A good nights sleep is healthy for the brain. Seven hours is recommended. If you have insomnia or poor sleep habits we can give you some instructions. If you have sleep apnea wear your mask.  Diet: Eating a heart healthy diet is also a good idea; fish and poultry instead of red meat, nuts (mostly non-peanuts), vegetables, fruits, olive oil or canola oil (instead of butter), minimal salt (use other spices to flavor foods), whole grain rice, bread, cereal and pasta and wine in moderation.Research is now showing that the MIND diet, which is a combination of The Mediterranean diet and the DASH diet, is beneficial for cognitive processing and longevity. Information about this diet can be found in The MIND Diet, a book by Alonna Minium, MS, RDN, and online at [employer]  Finances, Power of [address] and Advance Directives: You should consider putting legal safeguards in place with regard to financial and medical decision  making. While the spouse always has power of attorney for medical and financial issues in the absence of any form, you should consider what you want in case the spouse / caregiver is no longer around or capable of making decisions.      Heart-head connection  New research shows there are things we can do to reduce the risk of mild cognitive impairment and dementia.  Several conditions known to increase the risk of cardiovascular disease -- such as high blood pressure, diabetes and high cholesterol -- also increase the risk of developing Alzheimer's. Some autopsy studies show that as many as 80 percent of individuals with Alzheimer's disease also have cardiovascular disease.  A longstanding question is why some people develop hallmark Alzheimer's plaques and tangles but do not develop the symptoms of Alzheimer's. Vascular disease may help researchers eventually find an answer. Some autopsy studies suggest that plaques and tangles may be present in the brain without causing symptoms of cognitive decline unless the brain also shows evidence of vascular disease. More research is needed to better understand the link between vascular health and Alzheimer's.  Physical exercise and diet Regular physical exercise may be a beneficial strategy to lower the risk of Alzheimer's and vascular dementia. Exercise may directly benefit brain cells by increasing blood and oxygen flow in the brain. Because of its known cardiovascular benefits, a medically approved exercise program is a valuable part of any overall wellness plan.  Current evidence suggests that heart-healthy eating may also help protect the brain. Heart-healthy eating includes limiting the intake  of sugar and saturated fats and making sure to eat plenty of fruits, vegetables, and whole grains. No one diet is best. Two diets that have been studied and may be beneficial are the DASH (Dietary Approaches to Stop Hypertension) diet and the Mediterranean diet.  The DASH diet emphasizes vegetables, fruits and fat-free or low-fat dairy products; includes whole grains, fish, poultry, beans, seeds, nuts and vegetable oils; and limits sodium, sweets, sugary beverages and red meats. A Mediterranean diet includes relatively little red meat and emphasizes whole grains, fruits and vegetables, fish and shellfish, and nuts, olive oil and other healthy fats.  Social connections and intellectual activity A number of studies indicate that maintaining strong social connections and keeping mentally active as we age might lower the risk of cognitive decline and Alzheimer's. Experts are not certain about the reason for this association. It may be due to direct mechanisms through which social and mental stimulation strengthen connections between nerve cells in the brain.  Head trauma There appears to be a strong link between future risk of Alzheimer's and serious head trauma, especially when injury involves loss of consciousness. You can help reduce your risk of Alzheimer's by protecting your head.  Wear a seat belt  Use a helmet when participating in sports  "Fall-proof" your home   What you can do now While research is not yet conclusive, certain lifestyle choices, such as physical activity and diet, may help support brain health and prevent Alzheimer's. Many of these lifestyle changes have been shown to lower the risk of other diseases, like heart disease and diabetes, which have been linked to Alzheimer's. With few drawbacks and plenty of known benefits, healthy lifestyle choices can improve your health and possibly protect your brain.  Learn more about brain health. You can help increase our knowledge by considering participation in a clinical study. Our free clinical trial matching services, TrialMatch, can help you find clinical trials in your area that are seeking volunteers.

## 2021-04-09 LAB — VITAMIN D 25 HYDROXY (VIT D DEFICIENCY, FRACTURES): Vit D, 25-Hydroxy: 37.7 ng/mL (ref 30.0–100.0)

## 2021-04-09 LAB — VITAMIN B12: Vitamin B-12: 576 pg/mL (ref 232–1245)

## 2021-04-09 NOTE — Progress Notes (Signed)
Left a message for daughter Kristina Randall discussing normal lab results.    Kristina Norfolk, MD

## 2021-06-13 ENCOUNTER — Emergency Department (HOSPITAL_COMMUNITY): Payer: Medicare PPO

## 2021-06-13 ENCOUNTER — Encounter (HOSPITAL_COMMUNITY): Payer: Self-pay

## 2021-06-13 ENCOUNTER — Emergency Department (HOSPITAL_COMMUNITY)
Admission: EM | Admit: 2021-06-13 | Discharge: 2021-06-13 | Disposition: A | Discharge status: Home / Self Care | Payer: Medicare PPO | Attending: Emergency Medicine | Admitting: Emergency Medicine

## 2021-06-13 ENCOUNTER — Other Ambulatory Visit: Payer: Self-pay

## 2021-06-13 DIAGNOSIS — R4182 Altered mental status, unspecified: Secondary | ICD-10-CM | POA: Diagnosis not present

## 2021-06-13 DIAGNOSIS — Z20822 Contact with and (suspected) exposure to covid-19: Secondary | ICD-10-CM | POA: Diagnosis not present

## 2021-06-13 DIAGNOSIS — F0390 Unspecified dementia without behavioral disturbance: Secondary | ICD-10-CM | POA: Insufficient documentation

## 2021-06-13 DIAGNOSIS — R42 Dizziness and giddiness: Secondary | ICD-10-CM | POA: Diagnosis present

## 2021-06-13 DIAGNOSIS — I10 Essential (primary) hypertension: Secondary | ICD-10-CM | POA: Diagnosis not present

## 2021-06-13 DIAGNOSIS — N390 Urinary tract infection, site not specified: Secondary | ICD-10-CM | POA: Diagnosis not present

## 2021-06-13 DIAGNOSIS — W19XXXA Unspecified fall, initial encounter: Secondary | ICD-10-CM | POA: Insufficient documentation

## 2021-06-13 LAB — DIFFERENTIAL
Abs Immature Granulocytes: 0.03 10*3/uL (ref 0.00–0.07)
Basophils Absolute: 0 10*3/uL (ref 0.0–0.1)
Basophils Relative: 0 %
Eosinophils Absolute: 0.3 10*3/uL (ref 0.0–0.5)
Eosinophils Relative: 5 %
Immature Granulocytes: 0 %
Lymphocytes Relative: 24 %
Lymphs Abs: 1.8 10*3/uL (ref 0.7–4.0)
Monocytes Absolute: 0.4 10*3/uL (ref 0.1–1.0)
Monocytes Relative: 6 %
Neutro Abs: 4.8 10*3/uL (ref 1.7–7.7)
Neutrophils Relative %: 65 %

## 2021-06-13 LAB — CBC
HCT: 41.7 % (ref 36.0–46.0)
Hemoglobin: 13.2 g/dL (ref 12.0–15.0)
MCH: 27.1 pg (ref 26.0–34.0)
MCHC: 31.7 g/dL (ref 30.0–36.0)
MCV: 85.6 fL (ref 80.0–100.0)
Platelets: 260 10*3/uL (ref 150–400)
RBC: 4.87 MIL/uL (ref 3.87–5.11)
RDW: 16.8 % — ABNORMAL HIGH (ref 11.5–15.5)
WBC: 7.4 10*3/uL (ref 4.0–10.5)
nRBC: 0 % (ref 0.0–0.2)

## 2021-06-13 LAB — COMPREHENSIVE METABOLIC PANEL
ALT: 13 U/L (ref 0–44)
AST: 19 U/L (ref 15–41)
Albumin: 4.7 g/dL (ref 3.5–5.0)
Alkaline Phosphatase: 71 U/L (ref 38–126)
Anion gap: 8 (ref 5–15)
BUN: 13 mg/dL (ref 8–23)
CO2: 27 mmol/L (ref 22–32)
Calcium: 9.8 mg/dL (ref 8.9–10.3)
Chloride: 106 mmol/L (ref 98–111)
Creatinine, Ser: 0.96 mg/dL (ref 0.44–1.00)
GFR, Estimated: 58 mL/min — ABNORMAL LOW (ref >60)
Glucose, Bld: 102 mg/dL — ABNORMAL HIGH (ref 70–99)
Potassium: 3.4 mmol/L — ABNORMAL LOW (ref 3.5–5.1)
Sodium: 141 mmol/L (ref 135–145)
Total Bilirubin: 1.3 mg/dL — ABNORMAL HIGH (ref 0.3–1.2)
Total Protein: 8.7 g/dL — ABNORMAL HIGH (ref 6.5–8.1)

## 2021-06-13 LAB — RAPID URINE DRUG SCREEN, HOSP PERFORMED
Amphetamines: NOT DETECTED
Barbiturates: NOT DETECTED
Benzodiazepines: NOT DETECTED
Cocaine: NOT DETECTED
Opiates: NOT DETECTED
Tetrahydrocannabinol: NOT DETECTED

## 2021-06-13 LAB — I-STAT CHEM 8, ED
BUN: 12 mg/dL (ref 8–23)
Calcium, Ion: 1.26 mmol/L (ref 1.15–1.40)
Chloride: 105 mmol/L (ref 98–111)
Creatinine, Ser: 0.8 mg/dL (ref 0.44–1.00)
Glucose, Bld: 98 mg/dL (ref 70–99)
HCT: 42 % (ref 36.0–46.0)
Hemoglobin: 14.3 g/dL (ref 12.0–15.0)
Potassium: 3.5 mmol/L (ref 3.5–5.1)
Sodium: 143 mmol/L (ref 135–145)
TCO2: 29 mmol/L (ref 22–32)

## 2021-06-13 LAB — URINALYSIS, ROUTINE W REFLEX MICROSCOPIC
Bilirubin Urine: NEGATIVE
Glucose, UA: NEGATIVE mg/dL
Hgb urine dipstick: NEGATIVE
Ketones, ur: 20 mg/dL — AB
Nitrite: NEGATIVE
Protein, ur: NEGATIVE mg/dL
Specific Gravity, Urine: 1.015 (ref 1.005–1.030)
pH: 8 (ref 5.0–8.0)

## 2021-06-13 LAB — RESP PANEL BY RT-PCR (FLU A&B, COVID) ARPGX2
Influenza A by PCR: NEGATIVE
Influenza B by PCR: NEGATIVE
SARS Coronavirus 2 by RT PCR: NEGATIVE

## 2021-06-13 LAB — PROTIME-INR
INR: 1 (ref 0.8–1.2)
Prothrombin Time: 12.7 seconds (ref 11.4–15.2)

## 2021-06-13 LAB — APTT: aPTT: 32 seconds (ref 24–36)

## 2021-06-13 LAB — ETHANOL: Alcohol, Ethyl (B): <10 mg/dL (ref <10)

## 2021-06-13 MED ORDER — CEPHALEXIN 500 MG PO CAPS: 500.0000 mg | ORAL_CAPSULE | Freq: Four times a day (QID) | ORAL | 0 refills | Status: DC

## 2021-06-13 MED ORDER — MECLIZINE HCL 25 MG PO TABS
12.5000 mg | ORAL_TABLET | Freq: Once | ORAL | Status: AC
  Administered 2021-06-13: 11:00:00 12.5 mg via ORAL
  Filled 2021-06-13: qty 1

## 2021-06-13 MED ORDER — CEPHALEXIN 500 MG PO CAPS: 500.0000 mg | ORAL_CAPSULE | Freq: Four times a day (QID) | ORAL | 0 refills | Status: AC

## 2021-06-13 NOTE — Discharge Instructions (Addendum)
No evidence of stroke was seen on your work-up today. Your blood pressure has been somewhat high.  Here.  This should be rechecked in outpatient setting and reevaluate. You have some bacteria present in your urine.  Urine will be cultured and you were started on antibiotics. Please follow-up with your doctor soon as possible Return to the emergency department if you are having worsening symptoms at any time.

## 2021-06-13 NOTE — ED Triage Notes (Signed)
Family member unable to be located.

## 2021-06-13 NOTE — ED Notes (Signed)
Daughter arrives to triage and reports a fall 2 days ago. Unwitnessed but was heard. Dizziness ever since. Then slept much longer than normal. Upon waking suddenly started shaking and more confused than normal.

## 2021-06-13 NOTE — ED Notes (Signed)
Patient transported to CT 

## 2021-06-13 NOTE — ED Provider Notes (Signed)
Emergency Medicine Provider Triage Evaluation Note  Kristina Randall , a 87 y.o. female  was evaluated in triage.  Pt complains of fall yesterday.  She states she is having balance problems.  Denies similar symptoms in past.  Denies lateralized weakness, vision changes.  Review of Systems  Positive: Pain dizzy, hit head Negative: No neck pain, blood thinners  Physical Exam  BP (!) 173/90 (BP Location: Left Arm)    Pulse 82    Temp 98.7 F (37.1 C) (Oral)    Resp 15    Ht 1.702 m (5\' 7" )    Wt 64.4 kg    SpO2 100%    BMI 22.24 kg/m  Gen:   Awake, no distress   Resp:  Normal effort  MSK:   Moves extremities without difficulty  Other:  Oriented to person, not to year, hospital but not which one [facility]"  Medical Decision Making  Medically screening exam initiated at 7:00 AM.  Appropriate orders placed.  Kristina Randall was informed that the remainder of the evaluation will be completed by another provider, this initial triage assessment does not replace that evaluation, and the importance of remaining in the ED until their evaluation is complete.  CT done and pending Will add labs and Donovan Kail, MD 06/13/21 [phone]

## 2021-06-13 NOTE — ED Notes (Signed)
Pt going to MRI

## 2021-06-13 NOTE — ED Notes (Signed)
Tele neuro cart at bedside. EDP at bedside speaking with patients daughter and assessing patient at this time.

## 2021-06-13 NOTE — ED Provider Notes (Signed)
[facility] DEPT Provider Note   CSN: [id number] Arrival date & time: 06/13/21  5809     History Chief Complaint  Patient presents with   Fall   Altered Mental Status    Kristina Randall is a 87 y.o. female.  HPI 16-year-old female history of dementia Level 5 caveat secondary to dementia Some history obtained from patient's but daughter gave additional history 87 year old female with history of dementia presents today with new onset of dizziness that began yesterday.  Her daughter states that she called out to her yesterday morning and felt that she was very dizzy.  She requires some assistance with walking due to feeling off balance.  She stayed in bed mostly yesterday.  Patient continued to have some dizziness and difficulty walking.  She has not noted any lateralized weakness.  She has had some runny nose.  No cough, definite fever, chills, nausea, vomiting, or diarrhea have been noted.     Past Medical History:  Diagnosis Date   Heart murmur    Hypertension     Patient Active Problem List   Diagnosis Date Noted   Mild cognitive impairment with memory loss 04/28/2016   Rheumatic fever 01/29/2016   Essential hypertension 01/01/2016    Past Surgical History:  Procedure Laterality Date   ABDOMINAL HYSTERECTOMY  1973   with BSO-Fibroids   lymphectomy Right 1973   Breast- Benign   TONSILLECTOMY     WISDOM TOOTH EXTRACTION       OB History   No obstetric history on file.     Family History  Problem Relation Age of Onset   Heart attack Father    Hearing loss Father    Cancer Mother     Social History   Tobacco Use   Smoking status: Never   Smokeless tobacco: Never  Vaping Use   Vaping Use: Never used  Substance Use Topics   Alcohol use: Yes    Comment: occasional drink   Drug use: No    Home Medications Prior to Admission medications   Medication Sig Start Date End Date Taking? Authorizing Provider  cephALEXin (KEFLEX)  500 MG capsule Take 1 capsule (500 mg total) by mouth 4 (four) times daily. 06/13/21  Yes Pattricia Boss, MD  donepezil (ARICEPT) 10 MG tablet Take 1 tablet (10 mg total) by mouth at bedtime. 04/08/21 07/07/21  Alric Ran, MD    Allergies    Patient has no known allergies.  Review of Systems   Review of Systems  Constitutional:  Positive for unexpected weight change.       Patient has lost 20 to 30 pounds over the past couple of years  HENT:  Positive for rhinorrhea.   Eyes: Negative.   Respiratory: Negative.    Cardiovascular: Negative.   Gastrointestinal: Negative.   Endocrine: Negative.   Genitourinary: Negative.   Musculoskeletal: Negative.   Neurological:  Positive for dizziness and tremors. Negative for seizures, syncope, facial asymmetry, speech difficulty, weakness, light-headedness, numbness and headaches.  All other systems reviewed and are negative.  Physical Exam Updated Vital Signs BP (!) 169/63    Pulse (!) 56    Temp 97.8 F (36.6 C) (Oral)    Resp 16    Ht 1.702 m ('5\' 7"' )    Wt 64.4 kg    SpO2 100%    BMI 22.24 kg/m   Physical Exam Vitals and nursing note reviewed.  HENT:     Head: Normocephalic.     Right Ear:  External ear normal.     Left Ear: External ear normal.     Nose: Nose normal.     Mouth/Throat:     Mouth: Mucous membranes are moist.     Pharynx: Oropharynx is clear.  Eyes:     Extraocular Movements: Extraocular movements intact.     Pupils: Pupils are equal, round, and reactive to light.  Cardiovascular:     Rate and Rhythm: Normal rate and regular rhythm.  Pulmonary:     Effort: Pulmonary effort is normal.  Abdominal:     General: Abdomen is flat. Bowel sounds are normal.     Palpations: Abdomen is soft.  Musculoskeletal:        General: Normal range of motion.     Cervical back: Normal range of motion.  Skin:    General: Skin is warm and dry.     Capillary Refill: Capillary refill takes less than 2 seconds.  Neurological:      General: No focal deficit present.     Mental Status: She is alert.     Cranial Nerves: No cranial nerve deficit.     Sensory: No sensory deficit.     Motor: No weakness.     Coordination: Coordination normal.     Deep Tendon Reflexes: Reflexes normal.    ED Results / Procedures / Treatments   Labs (all labs ordered are listed, but only abnormal results are displayed) Labs Reviewed  CBC - Abnormal; Notable for the following components:      Result Value   RDW 16.8 (*)    All other components within normal limits  COMPREHENSIVE METABOLIC PANEL - Abnormal; Notable for the following components:   Potassium 3.4 (*)    Glucose, Bld 102 (*)    Total Protein 8.7 (*)    Total Bilirubin 1.3 (*)    GFR, Estimated 58 (*)    All other components within normal limits  URINALYSIS, ROUTINE W REFLEX MICROSCOPIC - Abnormal; Notable for the following components:   APPearance CLOUDY (*)    Ketones, ur 20 (*)    Leukocytes,Ua TRACE (*)    Bacteria, UA FEW (*)    All other components within normal limits  RESP PANEL BY RT-PCR (FLU A&B, COVID) ARPGX2  URINE CULTURE  ETHANOL  PROTIME-INR  APTT  DIFFERENTIAL  RAPID URINE DRUG SCREEN, HOSP PERFORMED  I-STAT CHEM 8, ED    EKG EKG Interpretation  Date/Time:  Thursday June 13 2021 07:18:53 EST Ventricular Rate:  68 PR Interval:  200 QRS Duration: 136 QT Interval:  470 QTC Calculation: 500 R Axis:   -56 Text Interpretation: Sinus rhythm IVCD, consider atypical RBBB LVH with IVCD and secondary repol abnrm Borderline prolonged QT interval Confirmed by Pattricia Boss [id number]) on 06/13/2021 8:32:21 AM  Radiology CT Head Wo Contrast  Result Date: 06/13/2021 CLINICAL DATA:  Fall 2 days ago.  Now with mental status changes. EXAM: CT HEAD WITHOUT CONTRAST CT CERVICAL SPINE WITHOUT CONTRAST TECHNIQUE: Multidetector CT imaging of the head and cervical spine was performed following the standard protocol without intravenous contrast. Multiplanar CT  image reconstructions of the cervical spine were also generated. COMPARISON:  09/02/2017 FINDINGS: CT HEAD FINDINGS Brain: No evidence of acute infarction, hemorrhage, hydrocephalus, extra-axial collection or mass lesion/mass effect. There is mild diffuse low-attenuation within the subcortical and periventricular white matter compatible with chronic microvascular disease. Prominence of the sulci and ventricles compatible with brain atrophy. Vascular: No hyperdense vessel or unexpected calcification. Skull: Normal. Negative for fracture or  focal lesion. Sinuses/Orbits: Retention cyst versus polyp identified in the left maxillary sinus. No acute abnormality identified. Other: None. CT CERVICAL SPINE FINDINGS Alignment: Normal. Skull base and vertebrae: No acute fracture. No primary bone lesion or focal pathologic process. Soft tissues and spinal canal: No prevertebral fluid or swelling. No visible canal hematoma. Disc levels: Disc space narrowing and ventral endplate spurring is identified at C5-6 and C6-7. Upper chest: Negative. Other: None IMPRESSION: 1. No acute intracranial abnormalities. 2. Chronic small vessel ischemic change and brain atrophy. 3. No evidence for cervical spine fracture. 4. Cervical degenerative disc disease. Electronically Signed   By: Kerby Moors M.D.   On: 06/13/2021 07:13   CT Cervical Spine Wo Contrast  Result Date: 06/13/2021 CLINICAL DATA:  Fall 2 days ago.  Now with mental status changes. EXAM: CT HEAD WITHOUT CONTRAST CT CERVICAL SPINE WITHOUT CONTRAST TECHNIQUE: Multidetector CT imaging of the head and cervical spine was performed following the standard protocol without intravenous contrast. Multiplanar CT image reconstructions of the cervical spine were also generated. COMPARISON:  09/02/2017 FINDINGS: CT HEAD FINDINGS Brain: No evidence of acute infarction, hemorrhage, hydrocephalus, extra-axial collection or mass lesion/mass effect. There is mild diffuse low-attenuation  within the subcortical and periventricular white matter compatible with chronic microvascular disease. Prominence of the sulci and ventricles compatible with brain atrophy. Vascular: No hyperdense vessel or unexpected calcification. Skull: Normal. Negative for fracture or focal lesion. Sinuses/Orbits: Retention cyst versus polyp identified in the left maxillary sinus. No acute abnormality identified. Other: None. CT CERVICAL SPINE FINDINGS Alignment: Normal. Skull base and vertebrae: No acute fracture. No primary bone lesion or focal pathologic process. Soft tissues and spinal canal: No prevertebral fluid or swelling. No visible canal hematoma. Disc levels: Disc space narrowing and ventral endplate spurring is identified at C5-6 and C6-7. Upper chest: Negative. Other: None IMPRESSION: 1. No acute intracranial abnormalities. 2. Chronic small vessel ischemic change and brain atrophy. 3. No evidence for cervical spine fracture. 4. Cervical degenerative disc disease. Electronically Signed   By: Kerby Moors M.D.   On: 06/13/2021 07:13   MR BRAIN WO CONTRAST  Result Date: 06/13/2021 CLINICAL DATA:  Mental status change.  Fall 2 days ago EXAM: MRI HEAD WITHOUT CONTRAST TECHNIQUE: Multiplanar, multiecho pulse sequences of the brain and surrounding structures were obtained without intravenous contrast. COMPARISON:  Head CT from earlier today FINDINGS: Brain: No acute infarction, hemorrhage, hydrocephalus, extra-axial collection or mass lesion. Bilateral temporal lobe atrophy for age. Chronic small vessel ischemia in the periventricular white matter, mild for age. Vascular: Normal flow voids. Skull and upper cervical spine: Normal marrow signal. Sinuses/Orbits: Retention cyst appearance in the left maxillary sinus. IMPRESSION: 1. No acute or reversible finding. 2. Notable temporal lobe atrophy, question dementia. Electronically Signed   By: Jorje Guild M.D.   On: 06/13/2021 09:29   DG Chest Portable 1  View  Result Date: 06/13/2021 CLINICAL DATA:  87 year old female with history of fever and altered mental status. Fall 2 days ago. EXAM: PORTABLE CHEST 1 VIEW COMPARISON:  Chest x-Kerisha Goughnour 10/06/2020. FINDINGS: Lung volumes are normal. No consolidative airspace disease. No pleural effusions. No pneumothorax. No pulmonary nodule or mass noted. Pulmonary vasculature and the cardiomediastinal silhouette are within normal limits. IMPRESSION: No radiographic evidence of acute cardiopulmonary disease. Electronically Signed   By: Vinnie Langton M.D.   On: 06/13/2021 08:05    Procedures Procedures   Medications Ordered in ED Medications  meclizine (ANTIVERT) tablet 12.5 mg (12.5 mg Oral Given 06/13/21 1036)  ED Course  I have reviewed the triage vital signs and the nursing notes.  Pertinent labs & imaging results that were available during my care of the patient were reviewed by me and considered in my medical decision making (see chart for details).  Clinical Course as of 06/13/21 1354  Thu Jun 13, 2021  0742 CT personally viewed and interpreted no acute abnormalities noted Radiology interpretation reviewed [DR]  3646 CBC ordered and personally interpreted and is normal [DR]  0830 Resident reviewed interpreted within normal limits C-Met reviewed and interpreted with mild hypokalemia.  Total bilirubin slightly elevated at 1.3 and total protein at 8.7 [DR]    Clinical Course User Index [DR] Pattricia Boss, MD   MDM Rules/Calculators/A&P                          Patient seen and evaluated with history obtained from patient and concur history obtained from daughter due to patient's dementia Labs, imaging, urinalysis, ordered Labs and imaging personally reviewed and interpreted and radiology interpretation reviewed as per clinical course Discussed with telemetry neurologist via phone Teleneurology also saw and evaluated patient and agreed with plan for MRI. MRI is reviewed and no evidence of  stroke is seen Given patient's vertigo symptoms, will treat with meclizine. Plan to attempt ambulation with assistance and obtain orthostatic vital sign Discussed results of MRI with the patient and daughter.  Patient feels improved.  We will attempt to ambulate.  She is requesting food and we will have give her something to eat. Urine reviewed and there is some bacter area. Plan Keflex Discussed return precautions and need for follow-up with patient and daughter and they voiced understanding. Final Clinical Impression(s) / ED Diagnoses Final diagnoses:  Vertigo  Urinary tract infection without hematuria, site unspecified    Rx / DC Orders ED Discharge Orders          Ordered    cephALEXin (KEFLEX) 500 MG capsule  4 times daily        06/13/21 1352             Pattricia Boss, MD 06/13/21 1354

## 2021-06-13 NOTE — ED Notes (Signed)
Patient ambulated to restroom with standby assist.  

## 2021-06-13 NOTE — Consult Note (Addendum)
Addendum: MRI brain wo: no acute abnormality.   Of note, after discussion with ED provider the consult was not supposed to be placed initially (is was mistakenly placed with an orderset). Discussed case with ED MD Dr. Rosalia Hammers.  [facility] Tele[facility]urology Consult Services  Stat Consult  Patient Name:   Thanh, Pomerleau Date of Birth:   Mar 31, 1934 Identification Number:   MRN - [id number] Date of Service:   06/13/2021 07:37:11  Diagnosis:       R42 - Dizziness/ Vertigo/ Giddiness  Impression 73F presents after a fall yesterday with dizziness. Unclear (as patient is demented) if dizziness/vertigo preceeded the fall or not.Diff include post-concussive syndrome, vertigo leading to fall, CVA. Agree with Mri brain wo for further evaluation. Can try symptomatic treatment with meclizine. No nystagmus, no recent illness, no focal neuro signs on exam. HCT unremarkable. Rec toxic metabolic infectious workup (COVID, UDS), symptomatic treatment of vertigo with fluids and meclizine and if symptoms resolve can dc home if MRI brian unremarkable even if symptoms don't resolve as this woul dmake post-concussive syndrome more likely. If MRI brain shows CVA rec ASA 325 mg and dmission for CVA workup.  CT HEAD: Reviewed  Our recommendations are outlined below.  Diagnostic Studies: MRI brain without contrast  Consultations: Toxic metabolic work up per primary team  Disposition: Neurology will follow  Free Text: Neuro will follow if admitted otherwise can f/u PCP after dc from ED   Imaging HCT unremarkable for acute pathology  Metrics: [facility] Notification Time: 06/13/2021 07:35:28 Stamp Time: 06/13/2021 07:37:11 Callback Response Time: 06/13/2021 07:39:15   ----------------------------------------------------------------------------------------------------  Chief Complaint: dizziness  History of Present Illness: Patient is a 87 year old Female. 73F pmhsf dementia  brought to ED by her daughter because pt has had severe dizziness before hitting her head on the corner of the counter yesterday in the bathroom but daughter didn't see it. Pt unable to get out of bed since the dizziness started. Slept for 5 hours afterwards. Was shaky and sweaty when she did wake up and was unsteady per daughter. No speech changes and was confused- wasn't making complete sense. Had a headache that started yesterday "sometime". Felt off balance. Had vertigo. Never had this before. No hx sz. No blurred vision, weakenss or numbness. No recent illness (no fever, SOB, chills). No new meds. Patient a poor historian and daughter provides most of the history.   Head CT: completed  MRI brain: ordered likely going in the next 30 minutes.   Past Medical History:      There is no history of Stroke  Medications:  No Anticoagulant use  No Antiplatelet use Reviewed EMR for current medications  Allergies:  Reviewed  Social History: Smoking: No Alcohol Use: No Drug Use: No  Family History:  There is no family history of premature cerebrovascular disease pertinent to this consultation  ROS : 14 Points Review of Systems was performed and was negative except mentioned in HPI.  Past Surgical History: There Is No Surgical History Contributory To Todays Visit   Examination: BP(180/64), Pulse(57), Blood Glucose(98) 1A: Level of Consciousness - Alert; keenly responsive + 0 1B: Ask Month and Age - Could Not Answer Either Question Correctly + 2 1C: Blink Eyes & Squeeze Hands - Performs Both Tasks + 0 2: Test Horizontal Extraocular Movements - Normal + 0 3: Test Visual Fields - No Visual Loss + 0 4: Test Facial Palsy (Use Grimace if Obtunded) - Normal symmetry + 0 5A: Test Left Arm Motor Drift -  No Drift for 10 Seconds + 0 5B: Test Right Arm Motor Drift - No Drift for 10 Seconds + 0 6A: Test Left Leg Motor Drift - No Drift for 5 Seconds + 0 6B: Test Right Leg Motor Drift - No  Drift for 5 Seconds + 0 7: Test Limb Ataxia (FNF/Heel-Shin) - No Ataxia + 0 8: Test Sensation - Normal; No sensory loss + 0 9: Test Language/Aphasia - Normal; No aphasia + 0 10: Test Dysarthria - Normal + 0 11: Test Extinction/Inattention - No abnormality + 0  NIHSS Score: 2     Patient / Family was informed the Neurology Consult would occur via TeleHealth consult by way of interactive audio and video telecommunications and consented to receiving care in this manner.  Patient is being evaluated for possible acute neurologic impairment and high probability of imminent or life - threatening deterioration.I spent total of 39 minutes providing care to this patient, including time for face to face visit via telemedicine, review of medical records, imaging studies and discussion of findings with providers, the patient and / or family.   Dr Amedeo Kinsman   [facility] [phone]  Case [id number]

## 2021-06-13 NOTE — ED Notes (Signed)
Pt ambulated w/o assistance. Standby for safety. O2 sat 100% t/o ambulation. Pt reports feeling no dizziness or pain.

## 2021-06-13 NOTE — ED Notes (Signed)
Attempted to collect urine sample. Pt has not had an output. Per pt request, this Clinical research associate removed female purewick. Pt states she is comfortable ambulating to the bathroom when she needs to w standby assist

## 2021-06-13 NOTE — ED Triage Notes (Signed)
Pt arrives via family member for fall, hitting head, and dizziness. In triage pt unable to verbalize what happened. Only saying "suddenly im not myself anymore" and unsure if she is in any pain. Ataxic. Pt studdering.

## 2021-06-14 LAB — URINE CULTURE

## 2021-06-20 ENCOUNTER — Encounter: Payer: Self-pay | Admitting: *Deleted

## 2021-06-20 ENCOUNTER — Telehealth: Payer: Self-pay | Admitting: Neurology

## 2021-06-20 NOTE — Telephone Encounter (Addendum)
I spoke to the patient this morning over the phone. She plans to drop off FMLA paperwork to be completed for her employer. 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 She is requesting 2-3 days each month for intermittent time away in order to care for mother (doctor's visits, medical care, transportation, etc). She may not need the entire amount of time but would like her job to be more protected, if the need arises. I spoke with Dr. Teresa Coombs and he feels this is reasonable. We will complete the paperwork once she faxes it to our office.  Additionally, we have provided a note on our letterhead stating her mother's condition and the need for assistance when making decisions concerning her financial/legal affairs. This has been signed by Dr. Teresa Coombs and is ready for pick up.  The patient has a documented diagnosis of Alzheimer's Disease. She has shown a decline on her MMSE. On 04/08/21, she scored 17/30.  Left message for her daughter (on [place]) that the letter is ready and she can pick it up during our office hours (provided on message). Also, we will watch for her FMLA paperwork to arrive.

## 2021-06-20 NOTE — Telephone Encounter (Signed)
Ladies - Patients daughter needs a letter for mother mental competency statement onletter head. Also daughter is applying for FMLA and needs certification of healthcare provider. I have documentation from the patients daughter and I will bring it to ladies.

## 2021-06-24 ENCOUNTER — Encounter: Payer: Self-pay | Admitting: *Deleted

## 2021-06-24 ENCOUNTER — Telehealth: Payer: Self-pay | Admitting: Neurology

## 2021-06-24 NOTE — Telephone Encounter (Signed)
The pt's daughter has called to inform that the letter prepared on 06-20-21 has pt's DOB as DOB: 07/17/2033 This needs to be corrected as this has to do with daughter trying to obtain POA of pt.

## 2021-06-24 NOTE — Telephone Encounter (Signed)
I corrected the birth date. Letter reprinted, signed and placed up front for the patient's daughter.

## 2021-06-24 NOTE — Telephone Encounter (Signed)
Left message letting her daughter know the corrected letter is ready for pick up. Provided our operating hours.

## 2021-06-25 DIAGNOSIS — Z0289 Encounter for other administrative examinations: Secondary | ICD-10-CM

## 2022-04-08 ENCOUNTER — Encounter: Payer: Self-pay | Admitting: Neurology

## 2022-04-08 ENCOUNTER — Ambulatory Visit: Payer: Medicare PPO | Admitting: Neurology

## 2022-04-08 VITALS — BP 136/66 | HR 59 | Ht 67.0 in | Wt 117.0 lb

## 2022-04-08 DIAGNOSIS — F02B0 Dementia in other diseases classified elsewhere, moderate, without behavioral disturbance, psychotic disturbance, mood disturbance, and anxiety: Secondary | ICD-10-CM

## 2022-04-08 DIAGNOSIS — G309 Alzheimer's disease, unspecified: Secondary | ICD-10-CM

## 2022-04-08 MED ORDER — MEMANTINE HCL 10 MG PO TABS: 10.0000 mg | ORAL_TABLET | Freq: Two times a day (BID) | ORAL | 11 refills | Status: DC

## 2022-04-08 NOTE — [place] Notes (Signed)
[facility]  PATIENT: Kristina Randall DOB: 1933/11/17  REFERRING CLINICIAN: Ellyn Hack, MD HISTORY FROM: Daughter  REASON FOR VISIT:  Memory decline    HISTORICAL  CHIEF COMPLAINT:  Chief Complaint  Patient presents with   Follow-up    Rm 15, with daughter  Reports no changes, needs reevaluation for POA paper work     INTERVAL HISTORY 04/08/22:  Patient presents today for follow-up, she is accompanied by her daughter.  Since last visit in October 2022, daughter has reported patient is about the same.  Patient does live with her daughter who take care of her.  Denies wandering outside the house, no agitation, no change in behavior and no worsening dementia or depression.  She is compliant with the Aricept 10 mg at night, denies any side effect from the medication.  Overall she is stable.  Daughter wants paperwork regarding capacity completed today.    HISTORY OF PRESENT ILLNESS:  This is a 88 year old woman with past medical history of hypertension tension who is presenting for worsening memory.  Patient is accompanied by her daughter Royce Macadamia who provide most of the history.  She reported her memory is worse, she is very forgetful, sometimes asking the same question over and over and does not remember new conversations.  She use to be independent, lives alone but in March the police called daughter because patient was found wandering on the street, when asked what she was doing she reported that she was looking for an apartment.  Since then, she has been living with her daughter.  Her daughter helps with her meal, and sometimes she helps with dressing her.  She reported good and bad days and on bad days she cannot even dress herself.  When she was living alone she had difficulty paying her bills on time, her stove was turned off by her apartment building because there was some concern of burning her pots.  She was seen in 2017 for memory impairment, at that time there  was discussion of starting Namenda or Aricept but the medication was not started.  At that time her Mini-Mental status score was a 24 out of 30 and on today's evaluation is 17 out of 30.     OTHER MEDICAL CONDITIONS: Hypertension   REVIEW OF SYSTEMS: Full 14 system review of systems performed and negative with exception of: HTN, memory decline   ALLERGIES: No Known Allergies  HOME MEDICATIONS: Outpatient Medications Prior to Visit  Medication Sig Dispense Refill   cephALEXin (KEFLEX) 500 MG capsule Take 1 capsule (500 mg total) by mouth 4 (four) times daily. 20 capsule 0   donepezil (ARICEPT) 10 MG tablet Take 1 tablet (10 mg total) by mouth at bedtime. 90 tablet 4   No facility-administered medications prior to visit.    PAST MEDICAL HISTORY: Past Medical History:  Diagnosis Date   Heart murmur    Hypertension     PAST SURGICAL HISTORY: Past Surgical History:  Procedure Laterality Date   ABDOMINAL HYSTERECTOMY  1973   with BSO-Fibroids   lymphectomy Right 1973   Breast- Benign   TONSILLECTOMY     WISDOM TOOTH EXTRACTION      FAMILY HISTORY: Family History  Problem Relation Age of Onset   Heart attack Father    Hearing loss Father    Cancer Mother     SOCIAL HISTORY: Social History   Socioeconomic History   Marital status: Legally Separated    Spouse name: Not on file  Number of children: Not on file   Years of education: Not on file   Highest education level: Not on file  Occupational History   Not on file  Tobacco Use   Smoking status: Never   Smokeless tobacco: Never  Vaping Use   Vaping Use: Never used  Substance and Sexual Activity   Alcohol use: Yes    Comment: occasional drink   Drug use: No   Sexual activity: Not Currently  Other Topics Concern   Not on file  Social History Narrative   Diet?      Do you drink/eat things with caffeine? Yes      Marital status?           divorced                         What year were you married? 1954       Do you live in a house, apartment, assisted living, condo, trailer, etc.? apartment      Is it one or more stories? yes      How many persons live in your home? 1      Do you have any pets in your home? (please list) no      Current or past profession: Geophysical data processor and sales      Do you exercise?        yes                              Type & how often? Walking and gym      Do you have a living will? no      Do you have a DNR form?                    ?              If not, do you want to discuss one? yes      Do you have signed POA/HPOA for forms?  ?      Social Determinants of Health   Financial Resource Strain: Low Risk  (05/20/2017)   Overall Financial Resource Strain (CARDIA)    Difficulty of Paying Living Expenses: Not hard at all  Food Insecurity: No Food Insecurity (05/20/2017)   Hunger Vital Sign    Worried About Running Out of Food in the Last Year: Never true    Ran Out of Food in the Last Year: Never true  Transportation Needs: No Transportation Needs (05/20/2017)   PRAPARE - Hydrologist (Medical): No    Lack of Transportation (Non-Medical): No  Physical Activity: Insufficiently Active (05/20/2017)   Exercise Vital Sign    Days of Exercise per Week: 3 days    Minutes of Exercise per Session: 20 min  Stress: No Stress Concern Present (05/20/2017)   [place]    Feeling of Stress : Only a little  Social Connections: Unknown (05/20/2017)   Social Connection and Isolation Panel [NHANES]    Frequency of Communication with Friends and Family: Patient refused    Frequency of Social Gatherings with Friends and Family: Patient refused    Attends Religious Services: 1 to 4 times per year    Active Member of [employer] or Organizations: Yes    Attends Archivist Meetings: Not on file    Marital Status: Separated  Intimate Partner Violence: Not At Risk (05/20/2017)    Humiliation, Afraid, Rape, and Kick questionnaire    Fear of Current or Ex-Partner: No    Emotionally Abused: No    Physically Abused: No    Sexually Abused: No    PHYSICAL EXAM  GENERAL EXAM/CONSTITUTIONAL: Vitals:  Vitals:   04/08/22 0917  BP: 136/66  Pulse: (!) 59  Weight: 117 lb (53.1 kg)  Height: 5\' 7"  (1.702 m)    Body mass index is 18.32 kg/m. Wt Readings from Last 3 Encounters:  04/08/22 117 lb (53.1 kg)  06/13/21 142 lb (64.4 kg)  04/08/21 142 lb (64.4 kg)   Patient is in no distress; well developed, nourished and groomed; neck is supple  EYES: Pupils round and reactive to light, Visual fields full to confrontation, Extraocular movements intacts,   MUSCULOSKELETAL: Gait, strength, tone, movements noted in Neurologic exam below  NEUROLOGIC: MENTAL STATUS:     04/08/2021    9:01 AM 05/20/2017   10:14 AM 04/28/2016    1:41 PM  MMSE - Mini Mental State Exam  Orientation to time 0 2 5  Orientation to Place 4 4 3   Registration 3 3 3   Attention/ Calculation 2 5 5   Recall 0 0 0  Language- name 2 objects 2 2 2   Language- repeat 1 1 1   Language- follow 3 step command 3 3 3   Language- read & follow direction 1 1 1   Write a sentence 1 1 1   Copy design 0 1 0  Total score 17 23 24    Able to name repeat, and compliant.  Able to count the number of quarters in $1.75 but has difficulty stating the days of the week backwards  CRANIAL NERVE:  2nd, 3rd, 4th, 6th - pupils equal and reactive to light, visual fields full to confrontation, extraocular muscles intact, no nystagmus 5th - facial sensation symmetric 7th - facial strength symmetric 8th - hearing intact 9th - palate elevates symmetrically, uvula midline 11th - shoulder shrug symmetric 12th - tongue protrusion midline  MOTOR:  normal bulk and tone, full strength in the BUE, BLE  SENSORY:  normal and symmetric to light touch, pinprick, temperature, vibration  COORDINATION:  finger-nose-finger, fine  finger movements normal  GAIT/STATION:  normal   DIAGNOSTIC DATA (LABS, IMAGING, TESTING) - I reviewed patient records, labs, notes, testing and imaging myself where available.  Lab Results  Component Value Date   WBC 7.4 06/13/2021   HGB 14.3 06/13/2021   HCT 42.0 06/13/2021   MCV 85.6 06/13/2021   PLT 260 06/13/2021      Component Value Date/Time   NA 143 06/13/2021 0750   K 3.5 06/13/2021 0750   CL 105 06/13/2021 0750   CO2 27 06/13/2021 0720   GLUCOSE 98 06/13/2021 0750   BUN 12 06/13/2021 0750   CREATININE 0.80 06/13/2021 0750   CREATININE 0.82 04/12/2018 1057   CALCIUM 9.8 06/13/2021 0720   PROT 8.7 (H) 06/13/2021 0720   ALBUMIN 4.7 06/13/2021 0720   AST 19 06/13/2021 0720   ALT 13 06/13/2021 0720   ALKPHOS 71 06/13/2021 0720   BILITOT 1.3 (H) 06/13/2021 0720   GFRNONAA 58 (L) 06/13/2021 0720   GFRNONAA 67 04/12/2018 1057   GFRAA 77 04/12/2018 1057   Lab Results  Component Value Date   CHOL 165 05/12/2017   HDL 52 05/12/2017   LDLCALC 94 05/12/2017   TRIG 96 05/12/2017   CHOLHDL 3.2 05/12/2017   No results found for: "HGBA1C" Lab  Results  Component Value Date   VITAMINB12 576 04/08/2021   Lab Results  Component Value Date   TSH 2.805 10/06/2020    Head CT 2019 Atrophy and chronic microvascular ischemia. No acute intracranial abnormality   ASSESSMENT AND PLAN  88 y.o. year old female with past medical history of hypertension who is presenting for follow up for her Alzheimer dementia.  Overall she is stable, denies any worsening of her memory, denies any hallucinations and agitation.  She is on Aricept 10 mg nightly, will add Namenda 10 mg twice daily.  Her recent labs including TSH and B12 were within normal limits.  Continue current medications, continue to follow-up with your PCP and return in 1 year. Letter regarding capacity provided to daughter.      1. Moderate Alzheimer's dementia without behavioral disturbance, psychotic disturbance,  mood disturbance, or anxiety, unspecified timing of dementia onset (HCC)     Patient Instructions  Continue with Aricept 10 mg nightly  Start with Namenda 10 mg daily for one week then increase to twice daily  Continue your other medications  Follow up in a year     There are well-accepted and sensible ways to reduce risk for Alzheimers disease and other degenerative brain disorders .  Exercise Daily Walk A daily 20 minute walk should be part of your routine. Disease related apathy can be a significant roadblock to exercise and the only way to overcome this is to make it a daily routine and perhaps have a reward at the end (something your loved one loves to eat or drink perhaps) or a personal trainer coming to the home can also be very useful. Most importantly, the patient is much more likely to exercise if the caregiver / spouse does it with him/her. In general a structured, repetitive schedule is best.  General Health: Any diseases which effect your body will effect your brain such as a pneumonia, urinary infection, blood clot, heart attack or stroke. Keep contact with your primary care doctor for regular follow ups.  Sleep. A good nights sleep is healthy for the brain. Seven hours is recommended. If you have insomnia or poor sleep habits we can give you some instructions. If you have sleep apnea wear your mask.  Diet: Eating a heart healthy diet is also a good idea; fish and poultry instead of red meat, nuts (mostly non-peanuts), vegetables, fruits, olive oil or canola oil (instead of butter), minimal salt (use other spices to flavor foods), whole grain rice, bread, cereal and pasta and wine in moderation.Research is now showing that the MIND diet, which is a combination of The Mediterranean diet and the DASH diet, is beneficial for cognitive processing and longevity. Information about this diet can be found in The MIND Diet, a book by Alonna Minium, MS, RDN, and online at  [employer]Wild[employer].es  Finances, Power of [address] and Advance Directives: You should consider putting legal safeguards in place with regard to financial and medical decision making. While the spouse always has power of attorney for medical and financial issues in the absence of any form, you should consider what you want in case the spouse / caregiver is no longer around or capable of making decisions.     Heart-head connection  New research shows there are things we can do to reduce the risk of mild cognitive impairment and dementia.  Several conditions known to increase the risk of cardiovascular disease -- such as high blood pressure, diabetes and high cholesterol -- also increase the  risk of developing Alzheimer's. Some autopsy studies show that as many as 80 percent of individuals with Alzheimer's disease also have cardiovascular disease.  A longstanding question is why some people develop hallmark Alzheimer's plaques and tangles but do not develop the symptoms of Alzheimer's. Vascular disease may help researchers eventually find an answer. Some autopsy studies suggest that plaques and tangles may be present in the brain without causing symptoms of cognitive decline unless the brain also shows evidence of vascular disease. More research is needed to better understand the link between vascular health and Alzheimer's.  Physical exercise and diet Regular physical exercise may be a beneficial strategy to lower the risk of Alzheimer's and vascular dementia. Exercise may directly benefit brain cells by increasing blood and oxygen flow in the brain. Because of its known cardiovascular benefits, a medically approved exercise program is a valuable part of any overall wellness plan.  Current evidence suggests that heart-healthy eating may also help protect the brain. Heart-healthy eating includes limiting the intake of sugar and saturated fats and making sure to eat plenty of  fruits, vegetables, and whole grains. No one diet is best. Two diets that have been studied and may be beneficial are the DASH (Dietary Approaches to Stop Hypertension) diet and the Mediterranean diet. The DASH diet emphasizes vegetables, fruits and fat-free or low-fat dairy products; includes whole grains, fish, poultry, beans, seeds, nuts and vegetable oils; and limits sodium, sweets, sugary beverages and red meats. A Mediterranean diet includes relatively little red meat and emphasizes whole grains, fruits and vegetables, fish and shellfish, and nuts, olive oil and other healthy fats.  Social connections and intellectual activity A number of studies indicate that maintaining strong social connections and keeping mentally active as we age might lower the risk of cognitive decline and Alzheimer's. Experts are not certain about the reason for this association. It may be due to direct mechanisms through which social and mental stimulation strengthen connections between nerve cells in the brain.  Head trauma There appears to be a strong link between future risk of Alzheimer's and serious head trauma, especially when injury involves loss of consciousness. You can help reduce your risk of Alzheimer's by protecting your head.  Wear a seat belt  Use a helmet when participating in sports  "Fall-proof" your home   What you can do now While research is not yet conclusive, certain lifestyle choices, such as physical activity and diet, may help support brain health and prevent Alzheimer's. Many of these lifestyle changes have been shown to lower the risk of other diseases, like heart disease and diabetes, which have been linked to Alzheimer's. With few drawbacks and plenty of known benefits, healthy lifestyle choices can improve your health and possibly protect your brain.  Learn more about brain health. You can help increase our knowledge by considering participation in a clinical study. Our free clinical trial  matching services, TrialMatch, can help you find clinical trials in your area that are seeking volunteers.      No orders of the defined types were placed in this encounter.    Meds ordered this encounter  Medications   memantine (NAMENDA) 10 MG tablet    Sig: Take 1 tablet (10 mg total) by mouth 2 (two) times daily.    Dispense:  30 tablet    Refill:  11     Return in about 1 year (around 04/09/2023).    Windell Norfolk, MD 04/08/2022, 1:22 PM  [facility] [address], [address]  [place], [place] [address] [phone]

## 2022-04-08 NOTE — Patient Instructions (Signed)
Continue with Aricept 10 mg nightly  Start with Namenda 10 mg daily for one week then increase to twice daily  Continue your other medications  Follow up in a year

## 2022-04-09 ENCOUNTER — Other Ambulatory Visit: Payer: Self-pay | Admitting: Neurology

## 2022-04-15 ENCOUNTER — Other Ambulatory Visit: Payer: Self-pay | Admitting: Neurology

## 2023-01-21 ENCOUNTER — Other Ambulatory Visit: Payer: Self-pay | Admitting: Neurology

## 2023-07-19 ENCOUNTER — Other Ambulatory Visit: Payer: Self-pay | Admitting: Neurology
